# Patient Record
Sex: Female | Born: 1935 | Race: White | Hispanic: No | Marital: Married | Smoking: Former smoker
Health system: Southern US, Community
[De-identification: ages and names within clinical notes are randomized; demographics above are authoritative.]

## PROBLEM LIST (undated history)

## (undated) DIAGNOSIS — I10 Essential (primary) hypertension: Secondary | ICD-10-CM

## (undated) DIAGNOSIS — I2510 Atherosclerotic heart disease of native coronary artery without angina pectoris: Secondary | ICD-10-CM

## (undated) DIAGNOSIS — F32A Depression, unspecified: Secondary | ICD-10-CM

## (undated) DIAGNOSIS — D649 Anemia, unspecified: Secondary | ICD-10-CM

## (undated) DIAGNOSIS — I219 Acute myocardial infarction, unspecified: Secondary | ICD-10-CM

## (undated) DIAGNOSIS — R911 Solitary pulmonary nodule: Secondary | ICD-10-CM

## (undated) DIAGNOSIS — E119 Type 2 diabetes mellitus without complications: Secondary | ICD-10-CM

## (undated) DIAGNOSIS — M1990 Unspecified osteoarthritis, unspecified site: Secondary | ICD-10-CM

## (undated) DIAGNOSIS — K810 Acute cholecystitis: Secondary | ICD-10-CM

## (undated) DIAGNOSIS — E785 Hyperlipidemia, unspecified: Secondary | ICD-10-CM

## (undated) DIAGNOSIS — I779 Disorder of arteries and arterioles, unspecified: Secondary | ICD-10-CM

## (undated) DIAGNOSIS — E7800 Pure hypercholesterolemia, unspecified: Secondary | ICD-10-CM

## (undated) DIAGNOSIS — I739 Peripheral vascular disease, unspecified: Secondary | ICD-10-CM

## (undated) DIAGNOSIS — Z8709 Personal history of other diseases of the respiratory system: Secondary | ICD-10-CM

## (undated) DIAGNOSIS — F329 Major depressive disorder, single episode, unspecified: Secondary | ICD-10-CM

## (undated) HISTORY — DX: Acute myocardial infarction, unspecified: I21.9

## (undated) HISTORY — DX: Peripheral vascular disease, unspecified: I73.9

## (undated) HISTORY — DX: Type 2 diabetes mellitus without complications: E11.9

## (undated) HISTORY — DX: Atherosclerotic heart disease of native coronary artery without angina pectoris: I25.10

## (undated) HISTORY — DX: Pure hypercholesterolemia, unspecified: E78.00

## (undated) HISTORY — DX: Depression, unspecified: F32.A

## (undated) HISTORY — DX: Acute cholecystitis: K81.0

## (undated) HISTORY — DX: Solitary pulmonary nodule: R91.1

## (undated) HISTORY — DX: Major depressive disorder, single episode, unspecified: F32.9

## (undated) HISTORY — DX: Disorder of arteries and arterioles, unspecified: I77.9

## (undated) HISTORY — DX: Anemia, unspecified: D64.9

## (undated) HISTORY — DX: Unspecified osteoarthritis, unspecified site: M19.90

## (undated) HISTORY — DX: Hyperlipidemia, unspecified: E78.5

## (undated) HISTORY — DX: Essential (primary) hypertension: I10

## (undated) HISTORY — DX: Personal history of other diseases of the respiratory system: Z87.09

---

## 1998-02-10 HISTORY — PX: CORONARY ANGIOPLASTY WITH STENT PLACEMENT: SHX49

## 1998-07-17 ENCOUNTER — Encounter: Payer: Self-pay | Admitting: Emergency Medicine

## 1998-07-17 ENCOUNTER — Inpatient Hospital Stay (HOSPITAL_COMMUNITY): Admission: EM | Admit: 1998-07-17 | Discharge: 1998-07-20 | Payer: Self-pay | Admitting: Emergency Medicine

## 2000-07-21 ENCOUNTER — Ambulatory Visit (HOSPITAL_COMMUNITY): Admission: RE | Admit: 2000-07-21 | Discharge: 2000-07-21 | Payer: Self-pay | Admitting: *Deleted

## 2000-08-05 ENCOUNTER — Ambulatory Visit (HOSPITAL_COMMUNITY): Admission: RE | Admit: 2000-08-05 | Discharge: 2000-08-05 | Payer: Self-pay | Admitting: Internal Medicine

## 2000-08-05 ENCOUNTER — Encounter: Payer: Self-pay | Admitting: Internal Medicine

## 2001-06-01 ENCOUNTER — Encounter: Payer: Self-pay | Admitting: Vascular Surgery

## 2001-06-02 ENCOUNTER — Ambulatory Visit (HOSPITAL_COMMUNITY): Admission: RE | Admit: 2001-06-02 | Discharge: 2001-06-02 | Payer: Self-pay | Admitting: Vascular Surgery

## 2001-06-02 HISTORY — PX: INTRACRANIAL ARTERY ANGIOPLASTY: SHX682

## 2001-06-09 ENCOUNTER — Inpatient Hospital Stay (HOSPITAL_COMMUNITY): Admission: RE | Admit: 2001-06-09 | Discharge: 2001-06-10 | Payer: Self-pay | Admitting: Vascular Surgery

## 2001-06-09 ENCOUNTER — Encounter (INDEPENDENT_AMBULATORY_CARE_PROVIDER_SITE_OTHER): Payer: Self-pay | Admitting: Specialist

## 2001-06-10 HISTORY — PX: CAROTID ENDARTERECTOMY: SUR193

## 2001-09-21 ENCOUNTER — Encounter: Payer: Self-pay | Admitting: Internal Medicine

## 2001-09-21 ENCOUNTER — Ambulatory Visit (HOSPITAL_COMMUNITY): Admission: RE | Admit: 2001-09-21 | Discharge: 2001-09-21 | Payer: Self-pay | Admitting: Internal Medicine

## 2003-08-30 ENCOUNTER — Inpatient Hospital Stay (HOSPITAL_COMMUNITY): Admission: EM | Admit: 2003-08-30 | Discharge: 2003-09-05 | Payer: Self-pay | Admitting: Emergency Medicine

## 2003-08-31 ENCOUNTER — Encounter (INDEPENDENT_AMBULATORY_CARE_PROVIDER_SITE_OTHER): Payer: Self-pay | Admitting: *Deleted

## 2003-08-31 HISTORY — PX: CHOLECYSTECTOMY, LAPAROSCOPIC: SHX56

## 2003-10-10 ENCOUNTER — Inpatient Hospital Stay (HOSPITAL_BASED_OUTPATIENT_CLINIC_OR_DEPARTMENT_OTHER): Admission: RE | Admit: 2003-10-10 | Discharge: 2003-10-10 | Payer: Self-pay | Admitting: Cardiology

## 2003-10-10 HISTORY — PX: CARDIAC CATHETERIZATION: SHX172

## 2003-10-24 ENCOUNTER — Ambulatory Visit (HOSPITAL_COMMUNITY): Admission: AD | Admit: 2003-10-24 | Discharge: 2003-10-26 | Payer: Self-pay | Admitting: Cardiology

## 2003-10-24 HISTORY — PX: CORONARY ANGIOPLASTY WITH STENT PLACEMENT: SHX49

## 2003-11-29 ENCOUNTER — Ambulatory Visit (HOSPITAL_COMMUNITY): Admission: RE | Admit: 2003-11-29 | Discharge: 2003-11-29 | Payer: Self-pay | Admitting: Internal Medicine

## 2004-02-19 ENCOUNTER — Ambulatory Visit: Payer: Self-pay | Admitting: Internal Medicine

## 2004-03-04 ENCOUNTER — Ambulatory Visit: Payer: Self-pay | Admitting: Internal Medicine

## 2004-03-12 ENCOUNTER — Ambulatory Visit: Payer: Self-pay | Admitting: Internal Medicine

## 2004-05-27 ENCOUNTER — Ambulatory Visit: Payer: Self-pay | Admitting: Internal Medicine

## 2004-11-13 IMAGING — CR DG CHEST 2V
2 series · 2 of 2 positions shown · non-contrast
Comparison: none

CLINICAL DATA: Chest pain.  Shortness of breath.  
 PA AND LATERAL CHEST ? 10/25/2003
 Comparison examination 09/04/2003.

[view not recorded (1 of 2)]
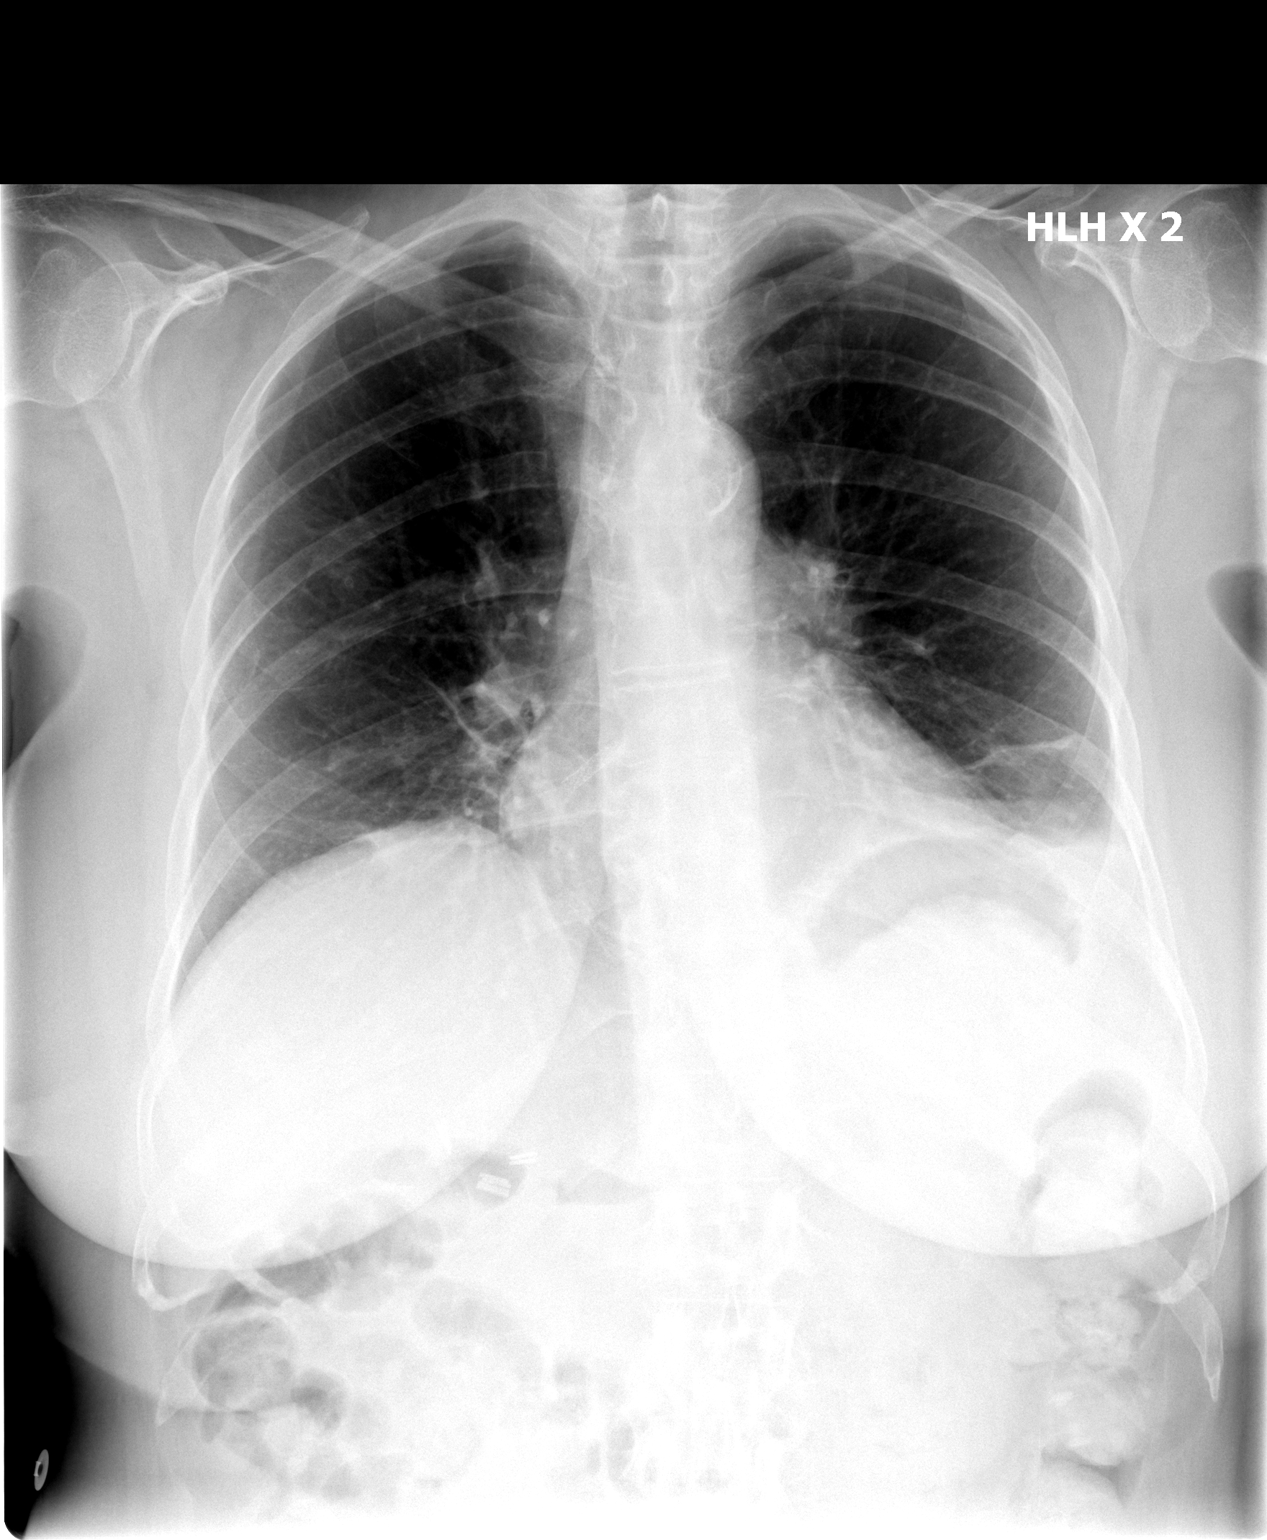

[view not recorded (2 of 2)]
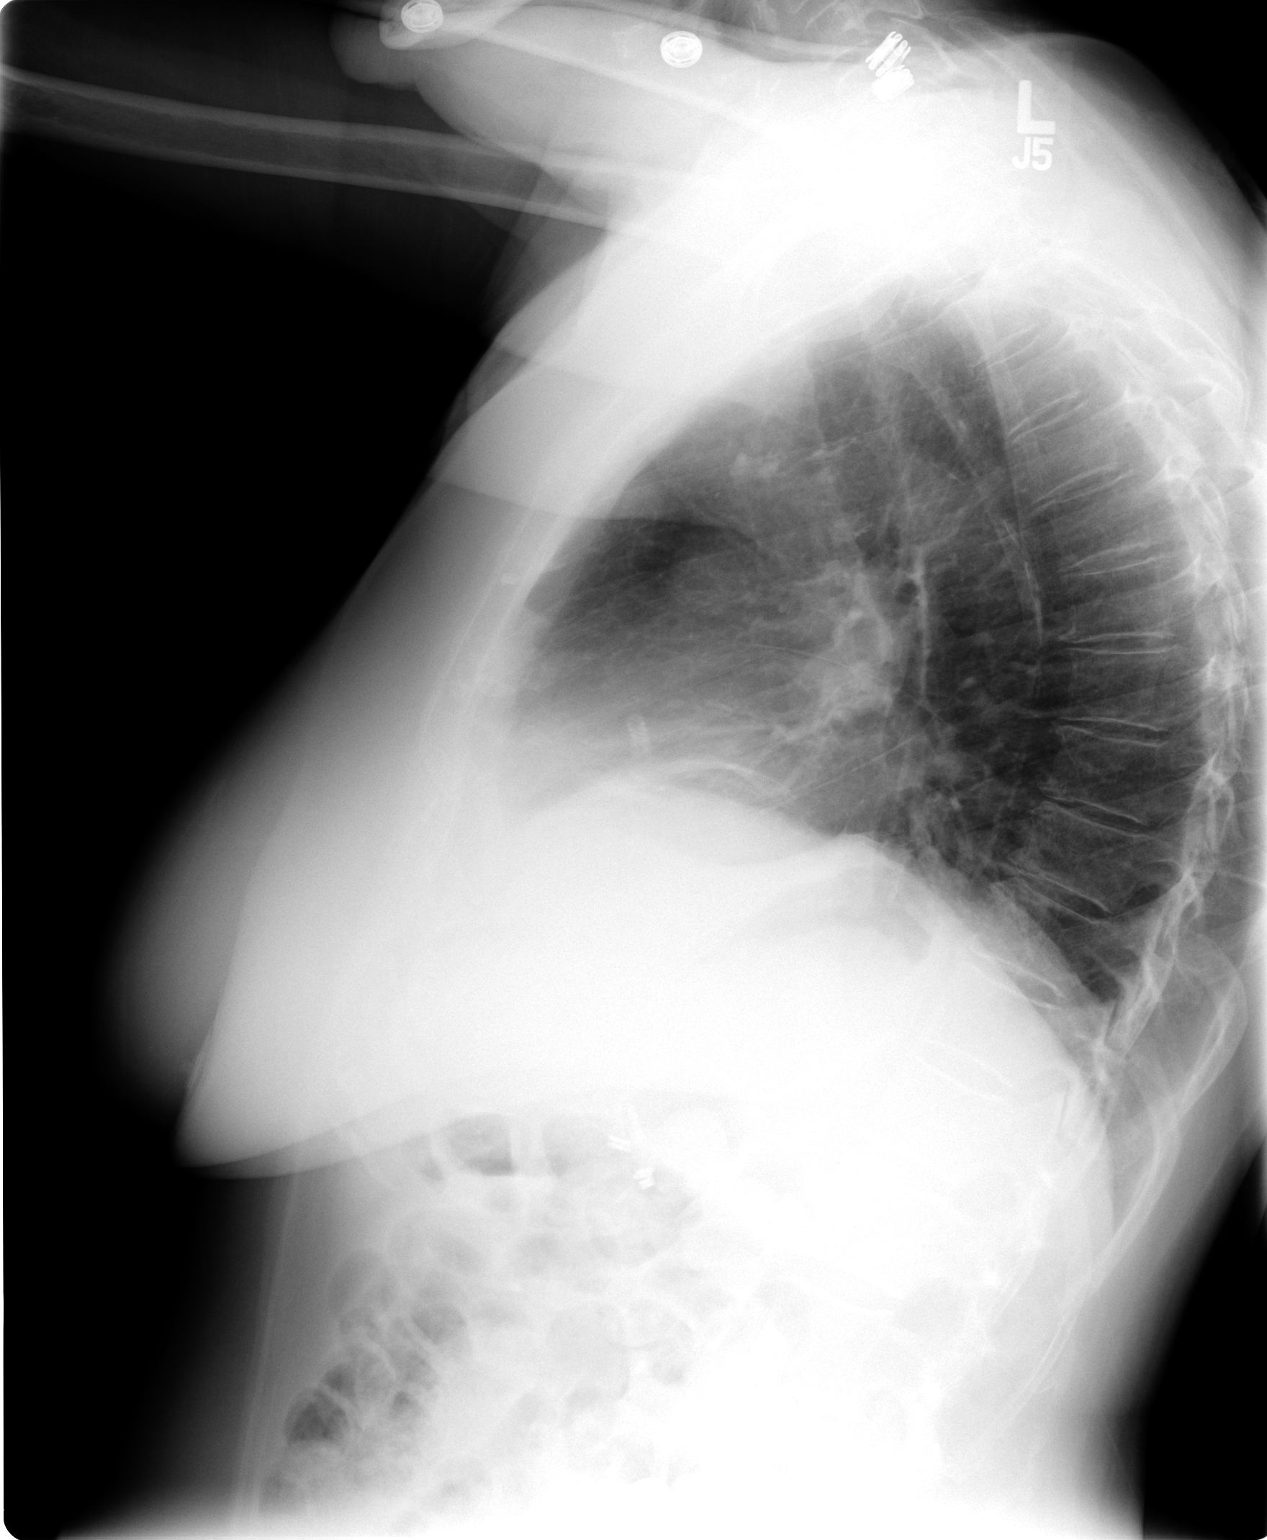

[2 of 2 positions shown; findings below may reference images not displayed]

FINDINGS: Coronary artery stent in place.  Poor inspiration with heart appearing slightly prominent in size. Bibasilar atelectatic changes.  Recommend followup until clearance.  No segmental infiltrate, congestive heart failure, or pneumothorax.  
 IMPRESSION 
 Poor inspiration with bibasilar atelectatic changes and slightly prominent appearance of the heart.  
 Coronary artery stent is noted.

## 2004-11-25 ENCOUNTER — Ambulatory Visit: Payer: Self-pay | Admitting: Internal Medicine

## 2004-12-26 ENCOUNTER — Ambulatory Visit (HOSPITAL_COMMUNITY): Admission: RE | Admit: 2004-12-26 | Discharge: 2004-12-26 | Payer: Self-pay | Admitting: Internal Medicine

## 2005-02-17 ENCOUNTER — Ambulatory Visit: Payer: Self-pay | Admitting: Internal Medicine

## 2005-07-14 ENCOUNTER — Ambulatory Visit: Payer: Self-pay | Admitting: Internal Medicine

## 2005-12-29 ENCOUNTER — Ambulatory Visit (HOSPITAL_COMMUNITY): Admission: RE | Admit: 2005-12-29 | Discharge: 2005-12-29 | Payer: Self-pay | Admitting: Internal Medicine

## 2006-01-12 ENCOUNTER — Ambulatory Visit: Payer: Self-pay | Admitting: Internal Medicine

## 2006-08-21 ENCOUNTER — Ambulatory Visit: Payer: Self-pay | Admitting: Internal Medicine

## 2006-12-31 ENCOUNTER — Ambulatory Visit (HOSPITAL_COMMUNITY): Admission: RE | Admit: 2006-12-31 | Discharge: 2006-12-31 | Payer: Self-pay | Admitting: Internal Medicine

## 2007-02-19 DIAGNOSIS — D539 Nutritional anemia, unspecified: Secondary | ICD-10-CM | POA: Insufficient documentation

## 2007-02-19 DIAGNOSIS — I10 Essential (primary) hypertension: Secondary | ICD-10-CM | POA: Insufficient documentation

## 2007-02-19 DIAGNOSIS — J449 Chronic obstructive pulmonary disease, unspecified: Secondary | ICD-10-CM

## 2007-02-19 DIAGNOSIS — Z862 Personal history of diseases of the blood and blood-forming organs and certain disorders involving the immune mechanism: Secondary | ICD-10-CM

## 2007-02-19 DIAGNOSIS — I2510 Atherosclerotic heart disease of native coronary artery without angina pectoris: Secondary | ICD-10-CM | POA: Insufficient documentation

## 2007-02-19 DIAGNOSIS — Z8639 Personal history of other endocrine, nutritional and metabolic disease: Secondary | ICD-10-CM

## 2007-02-19 DIAGNOSIS — J4489 Other specified chronic obstructive pulmonary disease: Secondary | ICD-10-CM | POA: Insufficient documentation

## 2007-02-22 ENCOUNTER — Ambulatory Visit: Payer: Self-pay | Admitting: Internal Medicine

## 2007-02-22 DIAGNOSIS — J984 Other disorders of lung: Secondary | ICD-10-CM

## 2007-02-22 DIAGNOSIS — Z9889 Other specified postprocedural states: Secondary | ICD-10-CM

## 2007-06-01 ENCOUNTER — Ambulatory Visit: Payer: Self-pay | Admitting: Internal Medicine

## 2007-07-28 ENCOUNTER — Telehealth: Payer: Self-pay | Admitting: Internal Medicine

## 2007-08-23 ENCOUNTER — Ambulatory Visit: Payer: Self-pay | Admitting: Internal Medicine

## 2007-08-23 DIAGNOSIS — I219 Acute myocardial infarction, unspecified: Secondary | ICD-10-CM | POA: Insufficient documentation

## 2007-08-23 DIAGNOSIS — E119 Type 2 diabetes mellitus without complications: Secondary | ICD-10-CM

## 2007-09-02 ENCOUNTER — Ambulatory Visit: Payer: Self-pay | Admitting: Internal Medicine

## 2007-10-19 ENCOUNTER — Encounter: Payer: Self-pay | Admitting: Internal Medicine

## 2007-12-24 ENCOUNTER — Ambulatory Visit: Payer: Self-pay | Admitting: Internal Medicine

## 2007-12-27 ENCOUNTER — Ambulatory Visit: Payer: Self-pay | Admitting: Cardiology

## 2008-01-03 ENCOUNTER — Ambulatory Visit (HOSPITAL_COMMUNITY): Admission: RE | Admit: 2008-01-03 | Discharge: 2008-01-03 | Payer: Self-pay | Admitting: Internal Medicine

## 2008-02-21 ENCOUNTER — Ambulatory Visit: Payer: Self-pay | Admitting: Internal Medicine

## 2008-04-24 ENCOUNTER — Ambulatory Visit: Payer: Self-pay | Admitting: Internal Medicine

## 2008-04-26 ENCOUNTER — Ambulatory Visit: Payer: Self-pay | Admitting: Cardiology

## 2008-05-05 ENCOUNTER — Ambulatory Visit: Payer: Self-pay | Admitting: Vascular Surgery

## 2008-07-25 ENCOUNTER — Ambulatory Visit: Payer: Self-pay | Admitting: Internal Medicine

## 2008-10-31 ENCOUNTER — Encounter: Payer: Self-pay | Admitting: Internal Medicine

## 2009-01-08 ENCOUNTER — Ambulatory Visit (HOSPITAL_COMMUNITY): Admission: RE | Admit: 2009-01-08 | Discharge: 2009-01-08 | Payer: Self-pay | Admitting: Internal Medicine

## 2009-04-26 ENCOUNTER — Ambulatory Visit: Payer: Self-pay | Admitting: Vascular Surgery

## 2009-05-01 ENCOUNTER — Encounter: Payer: Self-pay | Admitting: Internal Medicine

## 2009-07-24 ENCOUNTER — Ambulatory Visit: Payer: Self-pay | Admitting: Internal Medicine

## 2009-11-13 ENCOUNTER — Ambulatory Visit: Payer: Self-pay | Admitting: Cardiology

## 2009-11-15 ENCOUNTER — Ambulatory Visit: Payer: Self-pay | Admitting: Surgery

## 2009-11-16 ENCOUNTER — Ambulatory Visit: Payer: Self-pay | Admitting: Vascular Surgery

## 2010-03-12 NOTE — Assessment & Plan Note (Signed)
Summary: 12 months/apc   Primary Provider/Referring Provider:  Lavone Orn  CC:  Yearly Follow up.  Pt states she is using rescue inhaler more frequently with activity over the past year.  occ wheezing qhs and occ dry cough.  Denies chest tightness.  Marland Kitchen  History of Present Illness: 12/24/07-COPD, lung nodule CT 09/02/07 R mid lung nodule, 6 mm.Denies cough, phlegm, pain, nodes, blood , fever or sweats.Does notice bland rhinorhea. CT 11/09 rec f/u 6 mos for right lung nodules. She has appropriate appt in March.  02/21/08- COPD, lung nodule Nose runs,  clear secretion, no chest compliant or cough. Denies chest pain, palpitation, swollen glands, fever.  Meds are sufficient.  04/24/08- COPD, lung nodule , Hx DM, Hx MI Stayed in much of the winter and avoided significant respiratory infection. Denies cough, phlegm, fever, chest pain, palpitation or blood.  07/25/08- COPD, lung, Hx DM, Hx MI No change in exertional dyspnea, has to avoid heat, but denies chest pain, palpitation, cough, wheeze or phlegm. CT reviewed from March- stable right lung nodules consistent with granulomas, also some persitent elevation left diaphragm and calcified atherosclerosis.  July 24, 2009- COPD, lung nodules, Hx DM, Hx MI One year f/u. Denies acute problems.Admits in past year she has used rescue inhaler more often with exertion- estimating more than once daily. Only notes wheeze at night- prevented by use of Proventil at bedtime. Denies routine dry cough, as would be from Lisinopril. Blames sinus drainage. Denies anginal pain, palpitation or swelling. CXR- 04/2008- stable ? granulomas.  Preventive Screening-Counseling & Management  Alcohol-Tobacco     Smoking Status: quit > 6 months     Year Quit: 2000  Current Medications (verified): 1)  Simvastatin 20 Mg  Tabs (Simvastatin) .... Take 1 Tablet By Mouth Once A Day 2)  Lisinopril 40 Mg  Tabs (Lisinopril) 3)  Tramadol-Acetaminophen 37.5-325 Mg Tabs  (Tramadol-Acetaminophen) .Marland Kitchen.. 1 Every 6 Hours As Needed For Pain 4)  Metoprolol Succinate 25 Mg Xr24h-Tab (Metoprolol Succinate) .... Take 1 Tablet By Mouth Two Times A Day 5)  Glimepiride 2 Mg Tabs (Glimepiride) .Marland Kitchen.. 1 Every Morning 6)  Spiriva Handihaler 18 Mcg  Caps (Tiotropium Bromide Monohydrate) .... Inhale One Capsule Every Day 7)  Proventil Hfa 108 (90 Base) Mcg/act  Aers (Albuterol Sulfate) .... 2 Puffs Four Times A Day As Needed 8)  Nexium 40 Mg  Cpdr (Esomeprazole Magnesium) .... Take 1 Tablet By Mouth Once A Day 9)  Aricept 10 Mg Tabs (Donepezil Hcl) .Marland Kitchen.. 1 Once Daily 10)  Sertraline Hcl 100 Mg  Tabs (Sertraline Hcl) .... Take 1 By Mouth Once Daily 11)  Vitamin D 400 Unit  Caps (Cholecalciferol) .... Take 2 By Mouth Two Times A Day 12)  Calcium 600 600 Mg  Tabs (Calcium Carbonate) .... Take 1 By Mouth Two Times A Day 13)  Cvs Vitamin E 400 Unit  Caps (Vitamin E) .... Take 1 By Mouth At Bedtime 14)  Bayer Aspirin 325 Mg Tabs (Aspirin) .Marland Kitchen.. 1 Once Daily 15)  Fish Oil ?strength .... Once Daily  Allergies (verified): No Known Drug Allergies  Past History:  Past Medical History: Last updated: 12/24/2007 chronic elevation Left hemidiaphragm pulmonary restriction Coronary Heart Disease Diabetes, Type 2 Hypertension Myocardial Infarction chronic anemia Lung nodule, RML  Past Surgical History: Last updated: 02/21/2008 carotid endarterectomy -right Cholecystectomy  Family History: Last updated: March 03, 2007 Father died MI Mother- Alzheimers  Social History: Last updated: 02/21/2008 Patient states former smoker.  married  Risk Factors: Smoking Status: quit >  6 months (07/24/2009)  Social History: Smoking Status:  quit > 6 months  Review of Systems      See HPI       The patient complains of shortness of breath with activity.  The patient denies shortness of breath at rest, productive cough, non-productive cough, coughing up blood, chest pain, irregular heartbeats,  acid heartburn, indigestion, loss of appetite, weight change, abdominal pain, difficulty swallowing, sore throat, tooth/dental problems, headaches, nasal congestion/difficulty breathing through nose, and sneezing.    Vital Signs:  Patient profile:   77 year old female Height:      63 inches Weight:      160 pounds BMI:     28.45 O2 Sat:      97 % on Room air Pulse rate:   55 / minute BP sitting:   130 / 60  (right arm) Cuff size:   regular  Vitals Entered By: Gweneth Dimitri RN (July 24, 2009 1:57 PM)  O2 Flow:  Room air CC: Yearly Follow up.  Pt states she is using rescue inhaler more frequently with activity over the past year.  occ wheezing qhs, occ dry cough.  Denies chest tightness.   Comments Medications reviewed with patient Daytime contact number verified with patient. Gweneth Dimitri RN  July 24, 2009 1:57 PM    Physical Exam  Additional Exam:  General: A/Ox3; pleasant and cooperative, NAD, SKIN: no rash, lesions NODES: no lymphadenopathy HEENT: [place]/AT, EOM- WNL, Conjuctivae- clear, PERRLA, TM-WNL, Nose- clear, Throat- clear and wnl NECK: Supple w/ fair ROM, JVD- none, Pulsatile right carotid endarterectomy scar- feels like a carotid aneurysm. Thyroid-  CHEST: Clear to P&A, sounds are diminished with no rales, wheeze, rhonchi or dullness HEART: RRR, no m/g/r heard ABDOMEN:  UEA:VWUJ, nl pulses, no edema  NEURO: Grossly intact to observation      Impression & Recommendations:  Problem # 1:  LUNG NODULE (ICD-518.89)  Probably old granulomas. We will update CXR.  Problem # 2:  Hx of MYOCARDIAL INFARCTION (ICD-410.90)  Not symptomatic unless CAD contributes to exertional dyspnea. She shows me NTG, emphasizing that she never needs to take it. Her updated medication list for this problem includes:    Lisinopril 40 Mg Tabs (Lisinopril)    Metoprolol Succinate 25 Mg Xr24h-tab (Metoprolol succinate) .Marland Kitchen... Take 1 tablet by mouth two times a day    Bayer Aspirin 325 Mg  Tabs (Aspirin) .Marland Kitchen... 1 once daily    Nitrostat 0.4 Mg Subl (Nitroglycerin) .Marland Kitchen... As directed  Problem # 3:  COPD (ICD-496) Occasional need for rescue inhaler. I think she uses it for exertional dyspnea that may not inclue wheeze, but she doesn't seem to be overusing it. Continues Spiriva.  Medications Added to Medication List This Visit: 1)  Metoprolol Succinate 25 Mg Xr24h-tab (Metoprolol succinate) .... Take 1 tablet by mouth two times a day 2)  Fish Oil ?strength  .... Once daily 3)  Nitrostat 0.4 Mg Subl (Nitroglycerin) .... As directed  Other Orders: Est. Patient Level IV (99214) T-2 View CXR (71020TC)  Patient Instructions: 1)  Please schedule a follow-up appointment in 1 year. 2)  A chest x-ray has been recommended.  Your imaging study may require preauthorization.  3)  Scripts for Proventil rescue inhaler and for Spiriva. Prescriptions: PROVENTIL HFA 108 (90 BASE) MCG/ACT  AERS (ALBUTEROL SULFATE) 2 puffs four times a day as needed  #3 x 3   Entered and Authorized by:   Waymon Budge MD   Signed by:  Waymon Budge MD on 07/24/2009   Method used:   Print then Give to Patient   RxID:   [id number] SPIRIVA HANDIHALER 18 MCG  CAPS (TIOTROPIUM BROMIDE MONOHYDRATE) inhale one capsule every day  #90 x 3   Entered and Authorized by:   Waymon Budge MD   Signed by:   Waymon Budge MD on 07/24/2009   Method used:   Print then Give to Patient   RxID:   [id number] NITROSTAT 0.4 MG SUBL (NITROGLYCERIN) As directed  #1 bottle x prn   Entered and Authorized by:   Waymon Budge MD   Signed by:   Waymon Budge MD on 07/24/2009   Method used:   Print then Give to Patient   RxID:   [id number]    Immunization History:  Influenza Immunization History:    Influenza:  historical (10/11/2008)

## 2010-03-12 NOTE — Letter (Signed)
Summary: [facility]   Imported By: Sherian Rein 05/14/2009 11:56:28  _____________________________________________________________________  External Attachment:    Type:   Image     Comment:   External Document

## 2010-05-31 ENCOUNTER — Ambulatory Visit: Payer: Self-pay | Admitting: Vascular Surgery

## 2010-05-31 ENCOUNTER — Other Ambulatory Visit: Payer: Self-pay

## 2010-06-25 NOTE — Procedures (Signed)
CAROTID DUPLEX EXAM   INDICATION:  Followup of carotid disease.   HISTORY:  Diabetes:  Yes.  Cardiac:  PTCA and stent 07/1998.  Hypertension:  Yes.  Smoking:  Previous.  Previous Surgery:  Right carotid endarterectomy in 2003.  CV History:  Currently asymptomatic.  Amaurosis Fugax Yes No, Paresthesias Yes No, Hemiparesis Yes No                                       RIGHT             LEFT  Brachial systolic pressure:         175               155  Brachial Doppler waveforms:         Triphasic         Triphasic  Vertebral direction of flow:        Antegrade         Antegrade  DUPLEX VELOCITIES (cm/sec)  CCA peak systolic                   98                110  ECA peak systolic                   101               143  ICA peak systolic                   99                206  ICA end diastolic                   21                57  PLAQUE MORPHOLOGY:                  Calcified         Calcified / mixed  PLAQUE AMOUNT:                      Mild              Moderate to severe  PLAQUE LOCATION:                    CCA               CCA / ICA / ECA   IMPRESSION:  1. Patent right internal carotid artery with no evidence of      restenosis.  2. 60%-79% stenosis noted in the left internal carotid artery.  3. Bilateral common carotid artery calcified plaque noted.   ___________________________________________  Di Kindle. Edilia Bo, M.D.   CJ/MEDQ  D:  04/26/2009  T:  04/26/2009  Job:  [id number]

## 2010-06-25 NOTE — Assessment & Plan Note (Signed)
[facility]                             PULMONARY OFFICE NOTE   KALIEGH, WILLADSEN                        MRN:          [id number]  DATE:08/21/2006                            DOB:          1933/01/14    PULMONARY OFFICE FOLLOWUP.   PROBLEM:  1. Chronic obstructive pulmonary disease with pulmonary restriction.  2. Coronary disease/infarction/stent.  3. Diabetes.  4. Hypertension.  5. Chronic anemia.  6. Left diaphragm paresis.   HISTORY:  She stays in during the mid day to avoid heat and humidity but  otherwise has felt quite stable.  Very much likes the Proventil HFA  inhaler we gave her as a rescue inhaler last time and is using Advair,  as directed, as a maintenance medications.  No cough or phlegm, no  sudden events or acute changes.  We reviewed her medications.  She has  stopped Aciphex.  She continues lisinopril but well tolerated, and she  continues Spiriva.  She needs meds refilled.   OBJECTIVE:  Weight 157 pounds, BP 110/58, pulse 56, room air saturation  96%.  CHEST:  Quiet.  Breath sounds are a little diminished, unlabored.  HEART SOUNDS:  Regular.  I do not hear a murmur, there is no edema.   IMPRESSION:  Stable chronic obstructive pulmonary disease with left  hemidiaphragm paresis.   PLAN:  1. Refilled Proventil HFA, as a rescue inhaler, two puffs q.i.d.      p.r.n.  2. Refilled Advair 100/50, one puff b.i.d., as a maintenance med.  3. Schedule return in 6 months, anticipating chest x-ray and possibly      PFT then.     Clinton D. Maple Hudson, MD, Tonny Bollman, FACP  Electronically Signed    CDY/MedQ  DD: 08/21/2006  DT: 08/23/2006  Job #: [id number]   cc:   Juline Patch, M.D.  Peter M. [place], M.D.

## 2010-06-25 NOTE — Assessment & Plan Note (Signed)
OFFICE VISIT   Kara Price, Kara Price  DOB:  1932/07/04                                       11/16/2009  CHART#:[id number]   This is a new patient evaluation as she has not been seen by a physician  in over 3 years.   HISTORY OF PRESENT ILLNESS:  This is a 77 year old female that  previously has undergone a right carotid endarterectomy who now presents  with chief complaint of followup bilateral carotid stenosis.  Most  recently was seen in the peripheral lab with a bilateral carotid duplex  that was done on 04/26/2009.  It demonstrated no recurrence of the  stenosis in the right side and a 60%-79% stenosis on the left.  At this  point she denies any TIA or stroke symptoms, never has had any amaurosis  fugax or monocular blindness, never had any facial drooping or  hemiplegia.  She never has had any expressive or receptive aphasia.  Her  risk factors for her carotid disease include hyperlipidemia,  hypertension, diabetes.  She denies smoking.  In regards to her risk  factor management, she is currently on Zocor for statin, aspirin for  antiplatelets.  For her diabetes control, she is taking glimepiride and  for antihypertensive management she is on metoprolol and lisinopril.  The rest of her neuro review of systems was negative.  This patient  notes a greater than 8 year history of intermittent claudication.  She  notes that she develops calf pain with ambulating greater than 500 feet.  However, she notes that this does not interfere with any of her  activities of daily living.  She has never developed any gangrene or any  ulcers in either extremity and never has had any symptoms consistent  with rest pain.   PAST MEDICAL HISTORY:  1. Coronary artery disease.  2. History of peripheral arterial disease with intermittent      claudication.  3. 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  4. Osteoarthritis.  5. History of a heart attack.  6.  Diabetes.   PAST SURGICAL HISTORY:  1. Includes a cardiac cath with stenting.  2. Cholecystectomy.  3. Right carotid endarterectomy which was completed 06/09/2001 by Dr.      Hart [place].   SOCIAL HISTORY:  She never smoked.  Denies any alcohol or illicit drug  use.   FAMILY HISTORY:  significant for her father having a myocardial  infarction with coronary artery disease.  Mother's history is not  available.   MEDICATIONS:  included Zocor, lisinopril, metoprolol, glimepiride,  Zoloft, Spiriva, Tramadol/APAP, Nexium, Aricept, aspirin, vitamin D and  calcium.   ALLERGIES:  no known drug allergies.   REVIEW OF SYSTEMS:  On her review of systems today she noted shortness  of breath with exertion.  Otherwise the rest of her 12 point review of  systems was noted to be negative.   PHYSICAL EXAMINATION:  Vital signs:  Blood pressure 178/79, heart rate  of 97, respirations 12.  General:  Alert and oriented x3, well-developed, well-nourished, no  apparent distress.  Head:  Normocephalic, atraumatic.  No obvious temporalis wasting.  ENT:  Hearing was grossly intact.  Nares without any erythema or  drainage.  Oropharynx without any erythema or exudate.  Eyes:  Pupils were equal, round, reactive to light.  Extraocular  movements were intact.  Neck:  Soft.  No nuchal rigidity.  No palpable lymphadenopathy.  Pulmonary:  Symmetric expansion, good air movement, clear to  auscultation bilaterally.  No rales, rhonchi or wheezing.  Cardiac:  Regular rate and rhythm, normal S1, S2.  No murmurs, rubs,  thrills or gallops.  Vascular:  She had palpable upper extremity pulses  bilaterally.  The carotids had no bruits and the aorta was not palpable.  Bilateral femoral pulses were palpable.  The right popliteal, posterior  tibial and dorsal pedis were palpable.  The left side I could not  appreciate a palpable pulse in the popliteal, posterior tibial or dorsal  pedis.  GI:  Soft, nontender, nondistended.   No guarding, no rebound, no  hepatosplenomegaly.  No masses.  No costovertebral angle tenderness  bilaterally.  Musculoskeletal:  5/5 strength throughout all extremities.  There were  no wounds in any extremity.  Neurological:  Cranial nerves II-XII were intact.  The motor exam was as  listed above.  The sensation was intact in all extremities.  Psychiatric:  Judgment appeared intact.  Mood and affect were  appropriate for clinical situation.  Skin:  The extremity exam was as noted otherwise no rashes were noted  elsewhere.  Lymphatics:  There were no palpable cervical, axillary, inguinal  lymphadenopathy.   NONINVASIVE VASCULAR IMAGING:  There were two studies:  1. Bilateral carotid duplex: On the right side this is the      endarterectomy side per 2003, peak systolic of 99 with diastolic of      21.  This is consistent with no hemodynamically significant      internal carotid stenosis.  On the left side peak systolic velocity      is 206/57.  The ICA:CCA ratio was less than 2.  This is consistent      with a 60%-79% stenosis of the left internal carotid artery.  2. Bilateral lower extremity ABIs: She had on the right side an ABI of      0.75 with biphasic waveforms in the posterior tibial and dorsal      pedis.  On the left side we see monophasic waveforms in the left      leg with an ABI of 0.61.  This is consistent with bilateral      evidence of moderate peripheral arterial disease.  Based on      waveform analysis however the left one is probably consistent with      moderate to severe peripheral arterial disease.   MEDICAL DECISION MAKING:  This is a 77 year old female who is status  post a right carotid endarterectomy.  She has a left internal carotid  artery stenosis which at this point will be just observed on a q6 month  basis.  Additionally, at the same time I would repeat her ABIs to screen  her peripheral arterial disease.  At this point she has intermittent   claudication that is not lifestyle limiting.  She does not want any  intervention.  She is on appropriate medication regimen for both her  carotid disease and her peripheral arterial disease so there is no  benefit in her intermittent claudicate her to intervene aggressively.  The data shows for intermittent claudcation, 75% of these patients have  stable symptoms or improvement in symptoms.  She will follow up with [place]  at a 6 month basis for both her carotid and her lower extremity disease.  If she develops any symptomatology before then I have already instructed  her  she is to follow up with [place] and she agrees to proceed forward with  such.     Leonides Sake, MD  Electronically Signed   BC/MEDQ  D:  11/16/2009  T:  11/16/2009  Job:  [id number]

## 2010-06-25 NOTE — Procedures (Signed)
CAROTID DUPLEX EXAM   INDICATION:  Follow up carotid artery disease.   HISTORY:  Diabetes:  Yes.  Cardiac:  PTCA and stent, 07/1998.  Hypertension:  Yes.  Smoking:  Previous.  Previous Surgery:  Right CEA, 2003.  CV History:  Asymptomatic.  Amaurosis Fugax , Paresthesias , Hemiparesis                                       RIGHT             LEFT  Brachial systolic pressure:         155               152  Brachial Doppler waveforms:         Within normal limits                Within normal limits  Vertebral direction of flow:        Antegrade         Antegrade  DUPLEX VELOCITIES (cm/sec)  CCA peak systolic                   74                84  ECA peak systolic                   74                113  ICA peak systolic                   109               170  ICA end diastolic                   28                37  PLAQUE MORPHOLOGY:                  Calcific          Calcific  PLAQUE AMOUNT:                      Mild              Moderate  PLAQUE LOCATION:                    CCA               Bifurcation   IMPRESSION:  1. Right internal carotid artery shows no evidence of restenosis,      status post carotid endarterectomy.  2. Left internal carotid artery velocities suggest 40% to 59%      stenosis.  3. Velocities may be under-estimated due to a calcific  plaque and      acoustic shadowing.   ___________________________________________  V. Charlena Cross, MD   EM/MEDQ  D:  11/15/2009  T:  11/15/2009  Job:  [id number]

## 2010-06-25 NOTE — Procedures (Signed)
CAROTID DUPLEX EXAM   INDICATION:  Follow-up evaluation of known carotid artery disease.   HISTORY:  Diabetes:  Yes.  Cardiac:  PTCA and stent in June, 2000.  Hypertension:  Yes.  Smoking:  Quit in 2000.  Previous Surgery:  Right carotid endarterectomy with Dacron patch  angioplasty in 2003.  CV History:  Patient reports no cerebrovascular symptoms at this time.  Previous duplex on 10/01/04 revealed 1-39% right ICA stenosis and 40-59%  left ICA stenosis.  Amaurosis Fugax No, Paresthesias No, Hemiparesis No.                                       RIGHT             LEFT  Brachial systolic pressure:         180               180  Brachial Doppler waveforms:         Triphasic         Triphasic  Vertebral direction of flow:        Antegrade         Antegrade  DUPLEX VELOCITIES (cm/sec)  CCA peak systolic                   73                69  ECA peak systolic                   70                114  ICA peak systolic                   93                123  ICA end diastolic                   20                30  PLAQUE MORPHOLOGY:                  Heterogenous      Calcified  PLAQUE AMOUNT:                      Mild-to-moderate  Mild  PLAQUE LOCATION:                    Proximal ICA      Proximal ICA   IMPRESSION:  1. 20-39% right internal carotid artery stenosis, status post      endarterectomy.  2. 40-59% left internal carotid artery stenosis.  3. No significant change from previous study.   ___________________________________________  Quita Skye Hart [place], M.D.   MC/MEDQ  D:  05/05/2008  T:  05/05/2008  Job:  [id number]

## 2010-06-28 NOTE — Op Note (Signed)
[name], Kara Price                           ACCOUNT NO.:  [id number]   MEDICAL RECORD NO.:  [id number]                   PATIENT TYPE:  INP   LOCATION:  4710                                 FACILITY:  [facility]   PHYSICIAN:  Vikki Ports, M.D.         DATE OF BIRTH:  07-21-1932   DATE OF PROCEDURE:  08/31/2003  DATE OF DISCHARGE:  09/05/2003                                 OPERATIVE REPORT   PREOPERATIVE DIAGNOSIS:  Acute cholecystitis.   POSTOPERATIVE DIAGNOSIS:  Gangrenous cholecystitis.   PROCEDURE:  Laparoscopic cholecystectomy with interoperative cholangiogram.   SURGEON:  Vikki Ports, M.D.   ASSISTANT:  Ollen Gross. Carolynne Edouard, M.D.   ANESTHESIA:  General.   DESCRIPTION OF PROCEDURE:  The patient was taken to the operating room and  placed in a supine position.  After adequate general anesthesia was induced  using endotracheal tube, the abdomen was prepped and draped in the normal  sterile fashion.  Using a transverse infraumbilical incision, I dissected  down and into the fascia.  The fascia was opened vertically.  An 0 Vicryl  purse-string suture was placed around the fascial defect.  A Hasson trocar  was placed in the abdomen and the abdomen was insufflated with carbon  dioxide.  Under direct visualization, a 10 mm port was placed in the  subxiphoid region and two 5 mm ports were placed in the right abdomen.  The  gallbladder was identified, was very gangrenous, and cyst aspirator was used  to decompress the gallbladder.  It was grasped and retracted cephalad.  Dissection at the neck showed significant gangrene.  I identified the cystic  duct.  It was clipped proximally and ductotomy was made.  Cholangiogram was  performed which showed slow flow into the duodenum, questionable filling  defect in the distal common duct, but it cleared easily with additional  contrast.  The catheter was then removed.  The duct was triply clipped and  divided.  The cystic artery was  dissected free in a similar fashion, triply  clipped and divided.  The gallbladder was very difficult to take off the  gallbladder bed secondary to gangrene and inflammation.  It was placed in an  endocatch bag and removed through the umbilical port.  Adequate hemostasis  was insured.  Because of the significant amount of dissection, I opted to  leave a 19 Blake drain in the gallbladder bed.  This was brought out through  one of the 5 mm trocar stab wounds.  Pneumoperitoneum was released.  The  infraumbilical fascial defect was closed with Vicryl sutures.  The skin  incisions were closed with staples.  The patient tolerated the procedure  well and went to the PACU in stable condition.  Vikki Ports, M.D.    KRH/MEDQ  D:  09/09/2003  T:  09/09/2003  Job:  [id number]

## 2010-06-28 NOTE — Discharge Summary (Signed)
NAME:  Kara Price, Kara Price                           ACCOUNT NO.:  [id number]   MEDICAL RECORD NO.:  [id number]                   PATIENT TYPE:  OIB   LOCATION:  6527                                 FACILITY:  [facility]   PHYSICIAN:  Peter M. Swaziland, M.D.               DATE OF BIRTH:  1932-07-26   DATE OF ADMISSION:  10/24/2003  DATE OF DISCHARGE:  10/26/2003                                 DISCHARGE SUMMARY   PREOPERATIVE DIAGNOSIS:  Kara Price is a 77 year old white female who was  admitted for a coronary interventional procedure.  She had had a remote  inferior infarction in 2000 and had stenting of the proximal right coronary  artery.  She had recently been admitted in July with acute gangrenous  cholecystitis and underwent cholecystectomy.  She had some transient  congestive heart failure postoperatively and had elevated troponins at that  time.  Since then she has had refractory pain across her shoulders and down  her left arm and symptoms of persistent dyspnea.  CT of the chest was  unremarkable.  Cardiolite study showed a small area of inferolateral  ischemia, and cardiac catheterization subsequently demonstrated an 80%  stenosis in the distal right coronary artery and 40-50% focal stenosis  within the previous stented segment.  She is now admitted for coronary  intervention.  For details of her past medical history, social history,  family history, and physical exam, please see admission history and  physical.   HOSPITAL COURSE:  The patient was taken directly to the cardiac  catheterization laboratory.  She underwent a coronary intervention  procedure.  The previous stented segment in the proximal right coronary  artery was dilated using a 3.5 mm cutting balloon.  We then stented the  distal right coronary artery using a 3.0 x 18 mm Cypher drug-eluting stent.  This yielded an excellent angiographic result.  During the procedure the  patient was quite short of breath and had increased  respiratory rate.  This  appeared to improve with nebulizer therapy.  She did well following her  cardiac catheterization procedure with no groin complications, chest pain,  or other complaints, and her ECG remained stable.  However, she remained  persistently short of breath and was unable to lie flat.  The following  morning she had increased respiratory rate of 30 and was wheezing.  It was  felt that she would not be ready for discharge because of her respiratory  status.  Additional laboratory data was obtained.  Her BMET was normal with  the exception of glucose at 201.  White count was normal at 4700.  Hemoglobin was 10.8, which was at her baseline.  Her BNP level was 70.  We  did obtain an arterial blood gas in the catheterization lab, which showed a  pH of 7.33, PCO2 of 46, PO2 of 103, and bicarb of 24 on 3 L nasal cannula.  Chest x-ray showed bibasilar atelectasis.  It was felt that her breathing  difficulties were predominantly related to persistent atelectasis.  She was  started on nebulizer therapy with Atrovent and albuterol inhalers.  She was  started on Advair inhaler.  She was instructed in use of incentive  spirometry.  Her respiratory status improved significantly by the following  day, and it was felt that she was stable for discharge.   DISCHARGE DIAGNOSES:  1.  Atherosclerotic coronary artery disease with remote inferior myocardial      infarction.  Now status post stenting of the distal right coronary      artery.  2.  Persistent dyspnea secondary to bilateral atelectasis.  3.  Chronic anemia.  4.  Status post cholecystectomy.  5.  Diabetes mellitus type 2.  6.  Hypertension.   DISCHARGE MEDICATIONS:  Resume her prior medications, which include:   1.  Amaryl 4 mg per day.  2.  Zocor 20 mg daily.  3.  Celexa 40 mg per day.  4.  Zestril 40 mg per day.  5.  Metoprolol 25 mg twice a day.  6.  Lasix 20 mg per day.  7.  Potassium 20 mEq daily.  8.  Norvasc 5 mg  per day.  9.  Aspirin 325 mg daily.  10. Plavix 75 mg per day is added.  11. Albuterol 2.5 mg and Atrovent 0.5 mg nebulizer therapy q.6h.  12. Advair Diskus 100/50 mcg one puff b.i.d.   Will use incentive spirometry every two hours while awake.  Will use a low-  fat, carbohydrate-modified diet.  Will follow up with Dr. Swaziland in one  week.   DISCHARGE STATUS:  Improved.                                                Peter M. Swaziland, M.D.    PMJ/MEDQ  D:  10/26/2003  T:  10/26/2003  Job:  [id number]   cc:   Juline Patch, M.D.  [address]  [place], [place] [address]  Fax: [phone]   Lorne Skeens. Hoxworth, M.D.  [address], [address]  [place]  [place] [address]  Fax: [phone]

## 2010-06-28 NOTE — H&P (Signed)
NAMENEVA, Kara Price                           ACCOUNT NO.:  [id number]   MEDICAL RECORD NO.:  [id number]                   Price TYPE:  AMB   LOCATION:                                       FACILITY:  [facility]   PHYSICIAN:  Peter M. Swaziland, M.D.               DATE OF BIRTH:  06-05-1932   DATE OF ADMISSION:  10/10/2003  DATE OF DISCHARGE:                                HISTORY & PHYSICAL   HISTORY OF PRESENT ILLNESS:  Kara Price is a 77 year old white female with  history of coronary artery disease.  She was admitted approximately three  weeks ago with acute gangrenous cholecystitis and underwent a laparoscopic  cholecystectomy.  Postoperatively she was hypotensive and was given a lot of  fluids.  She subsequently developed acute volume overload with congestive  heart failure, had a mild increase in her cardiac troponins.  She was  treated medically with IV diuresis and improved.  Since discharge she has  complained of hurting across her shoulders bilaterally and midway down her  left biceps.  She has had no significant chest pain but does get short of  breath with activity.  To further evaluate her symptoms she underwent an  adenosine Cardiolite study on September 22, 2003.  This showed a mild area of  ischemia in Kara inferolateral wall.  Left ventricular systolic function was  moderately reduced with ejection fraction of 39% with inferolateral  hypokinesia.  These findings were new compared to 2001 and because of this,  she is now admitted for cardiac catheterization.  Kara Price does have a  history of coronary disease.  She is status post acute inferior myocardial  infarction in June 2000.  At that time she had emergent stenting of Kara  proximal right coronary artery using a 3.5 x 18 mm NIR Royale stent.  She  was also noted at that time to have a 60-70% stenosis in Kara left circumflex  coronary artery.  Her last follow-up Cardiolite study in 2001 was normal.   PAST MEDICAL HISTORY:  1. Recent cholecystectomy for gangrenous cholecystitis.  2. History of coronary artery disease with remote inferior myocardial     infarct treated with direct stenting.  3. Recent congestive heart failure post cholecystectomy.  4. Hypertension.  5. Diabetes mellitus, noninsulin-requiring.  6. Hyperlipidemia.   CURRENT MEDICATIONS:  1. Coated aspirin 325 mg daily.  2. Amaryl 4 mg per day.  3. Zocor 20 mg per day.  4. Celexa 40 mg per day.  5. Metoprolol 25 mg b.i.d.  6. Zestril 40 mg per day.  7. Lasix 20 mg per day.  8. Potassium 20 mEq per day.  9. Norvasc 5 mg per day.   She has no known allergies.   Other surgery history includes a previous right carotid endarterectomy.   FAMILY HISTORY:  Father died of myocardial infarction in his 65s.  Mother is  in her 90s and doing well.  She has a brother who has coronary artery  disease.   SOCIAL HISTORY:  Kara Price is married.  She denies tobacco or alcohol use.   PHYSICAL EXAMINATION:  GENERAL:  Kara Price is a pleasant white female in  no apparent distress.  VITAL SIGNS:  Her weight is 150.  Blood pressure is 128/70, pulse 72 and  regular.  HEENT:  Normocephalic, atraumatic.  Pupils equal, round, and reactive.  Conjunctivae are clear.  Oropharynx is clear.  NECK:  Supple without JVD, adenopathy, thyromegaly, or bruits.  She has an  old right CEA scar.  CHEST:  Lungs were clear.  CARDIAC:  Regular rate and rhythm without gallop, murmur, or click.  ABDOMEN:  Her laparoscopic scars have healed well.  EXTREMITIES:  She has no lower extremity edema.  Pulses are 2+ and  symmetric.  NEUROLOGIC:  Nonfocal.   LABORATORY DATA:  Resting ECG shows normal sinus rhythm with nonspecific T-  wave abnormality.  Most recent chest x-ray dated September 04, 2003, showed small  bilateral effusions, otherwise clear.   IMPRESSION:  1. Atherosclerotic coronary artery disease with remote inferior myocardial     infarction, treated with direct  stenting of Kara right coronary artery.     Now with symptoms of bilateral shoulder and arm pain, possibly anginal     equivalent.  Adenosine Cardiolite study is abnormal showing evidence of     inferolateral ischemia.  2. Acute congestive heart failure post cholecystectomy secondary to volume     overload.  Moderate left ventricular dysfunction by Cardiolite study.  3. Status post cholecystectomy.  4. Diabetes mellitus, noninsulin-requiring.  5. Hypertension.  6. Hyperlipidemia.  7. Status post right carotid endarterectomy.   PLAN:  Will proceed with cardiac catheterization with further therapy  pending these results.                                                Peter M. Swaziland, M.D.    PMJ/MEDQ  D:  10/04/2003  T:  10/04/2003  Job:  [id number]   cc:   Juline Patch, M.D.  [address]  [place], [place] [address]  Fax: [phone]   Angelia Mould. Derrell Lolling, M.D.  [address]  [place]  [place] [address]  Fax: [phone]

## 2010-06-28 NOTE — Consult Note (Signed)
NAME:  Kara Price, Kara Price                           ACCOUNT NO.:  [id number]   MEDICAL RECORD NO.:  [id number]                   PATIENT TYPE:  INP   LOCATION:  4710                                 FACILITY:  [facility]   PHYSICIAN:  Petra Kuba, M.D.                 DATE OF BIRTH:  Dec 22, 1932   DATE OF CONSULTATION:  DATE OF DISCHARGE:                                   CONSULTATION   REQUESTING PHYSICIAN:  Peter M. Swaziland, M.D.   HISTORY:  The patient is seen at the request of Dr. Swaziland for abdominal  pain and abnormal CAT scan pertinent for a gallstone at the neck of the  gallbladder and no other obvious findings.  She had pain about a week ago  that radiated to her back and then yesterday had increasing upper abdominal  pain and some nausea and vomiting.  Today she had a fever.  She has really  not had much GI symptoms.  GI cocktails have not helped.  At home she did  take some pain medicines but has not used any other antacids.  She had a  colonoscopy a few years ago by Dr. Virginia Rochester which showed hemorrhoids only but has  had no other GI tests, ultrasounds, etcetera.  Never been told she had  gallstones before.   PAST MEDICAL HISTORY:  1. Coronary artery disease.  2. History of an MI as well as a stent.  3. Hypertension.  4. Carotid endarterectomy.   ALLERGIES:  None.   CURRENT MEDICINES:  Celexa, Zocor, Lopressor, Amaryl, Zestril, and an  aspirin.   FAMILY HISTORY:  Pertinent for some gallbladder problems in the family but  no other GI problems.   SOCIAL HISTORY:  Does not smoke or drink.  Does use the aspirin as mentioned  above.   REVIEW OF SYSTEMS:  Negative except as above.  Her lower bowels have been  normal.   PHYSICAL EXAMINATION:  GENERAL:  No acute distress, although slightly  uncomfortable.  VITAL SIGNS:  Temperature pertinent for being 101, other vital signs stable.  ABDOMEN:  Soft.  Very minimal upper tenderness.  No guarding or rebound.  Occasional bowel  sounds.   LABS:  Pertinent for a white count of 11, slight left shift, hemoglobin 10.6  with an MCV of 85.  PT 12.8.  Liver tests normal.  Lipase normal.   CT reviewed with Dr. Faylene Million pertinent for the gallstone in the neck of the  gallbladder with some stranding.   ASSESSMENT:  1. Abdominal pain, nausea, low-grade fever probably due to gallstones.  2. Acute cholecystitis.   PLAN:  I have discussed the case with Dr. Luan Pulling.  We will go ahead and  start antibiotics, cefotetan.  She would like an ultrasound.  Possibly will  need a HIDA scan and will be on standby to help if needed per surgical team  but otherwise I will  wait on their opinion.                                               Petra Kuba, M.D.    MEM/MEDQ  D:  08/30/2003  T:  08/30/2003  Job:  [id number]   cc:   Petra Kuba, M.D.  [address]  [place]  [place] [address]  Fax: [phone]   Peter M. Swaziland, M.D.  [address]  [place], [place] [address]  Fax: [phone]   Vikki Ports, M.D.  [phone] [address]  [place]  [place] [address]  Fax: [phone]

## 2010-06-28 NOTE — H&P (Signed)
NAMECRYSTALE, GIANNATTASIO                           ACCOUNT NO.:  [id number]   MEDICAL RECORD NO.:  [id number]                   PATIENT TYPE:  INP   LOCATION:  1826                                 FACILITY:  [facility]   PHYSICIAN:  Quita Skye. Waldon Reining, MD             DATE OF BIRTH:  1932-04-10   DATE OF ADMISSION:  08/29/2003  DATE OF DISCHARGE:                                HISTORY & PHYSICAL   IDENTIFYING DATA AND CHIEF COMPLAINT:  Kara Price is a 77 year old white  woman who is admitted to [facility] for further evaluation of lower  chest and upper abdominal pain.   HISTORY OF PRESENT ILLNESS:  The patient presented to the emergency  department with a history of an ache in her lower substernum and upper  abdomen, which began approximately four hours ago.  The discomfort began as  she was preparing to go shopping.  The discomfort, which cannot be described  more specifically other than just an ache, is located in the distal  substernal region, epigastrium and left lower quadrant.  There are no  exacerbating or ameliorating factors.  It appears to be unrelated to  position, activity, meals or respirations.  She has experienced nausea and  several episodes of vomiting in association with the pain.  There has been  no hematemesis.  There has also been dyspnea and diaphoresis.  The  discomfort appears to be unaffected by nitroglycerin, which she took at  home.  It has improved somewhat, though it has not resolved since her  presentation to the emergency department.   PAST HISTORY:  The patient has a history of coronary artery disease.  She  suffered a myocardial infarction in 2000 and underwent stenting (previous  medical records not yet available).  There is no history of congestive heart  failure or arrhythmia.  She is uncertain as to whether tonight the  discomfort is similar to that which heralded her acute myocardial  infarction.  There has been no diarrhea or hematochezia.   There has been no  change in bowel movements.  She has no other abdominal symptoms.   The patient also has a history of:  1. Hypertension.  2. Dyslipidemia.  3. Type II diabetes mellitus.  4. There is also a past history of depression.   MEDICATIONS:  The patient is on a number of medications.  These include  Celexa, Zocor, metoprolol, lisinopril, and Amaryl.   ALLERGIES:  The patient is not allergic to any medications.   PAST SURGICAL HISTORY:  Right carotid endarterectomy.   ACCIDENTS AND INJURIES:  Significant injuries; none.   FAMILY HISTORY:  Father died of a myocardial infarction in his 27s.  Mother  is in her 62s and well.  She has a brother who has coronary artery disease.  Family history is otherwise unremarkable.   REVIEW OF SYSTEMS:  Review of systems reveals no problems related to her  head, eyes, ears, nose, mouth, throat, lungs, gastrointestinal system,  genitourinary system, or extremities.  There is no history of neurologic or  psychiatric disorder or than as described above.  There is no history of  fever, chills or weight loss.  There is no history of dysuria, hematuria,  frequency or urgency.   PHYSICAL EXAMINATION:  VITAL SIGNS:  Blood pressure 179/78.  Pulse 61 and  regular.  Respirations 20.  Temperature 98.4.  GENERAL APPEARANCE:  The patient is an obese elderly white woman in no acute  distress.  She is alert, oriented, appropriate and responsive.  HEENT:  Head, eye, nose and mouth are normal.  NECK:  The neck is without thyromegaly or adenopathy.  Carotid pulses are  palpable bilaterally and without bruits.  A healed right carotid  endarterectomy scar is noted.  HEART:  Cardiac examination reveals a normal S1 and S2.  There is no S3, S4,  murmur, rub, or click.  Cardiac rhythm is regular.  CHEST:  No chest wall tenderness is noted.  LUNGS:  The lungs are clear.  ABDOMEN:  Examination of the abdomen reveals mild tenderness upon palpation  of the  epigastrium, left upper quadrant and mid abdomen.  There is no mass,  hepatosplenomegaly, rebound, guarding, rigidity, or distention.  Bowel  sounds are present.  RECTAL:  Rectal examination performed by the nurse retrieved stool, which  was heme negative.  EXTREMITIES:  The extremities are without edema, deviation or deformity.  Radial and dorsalis pedal pulses are palpable bilaterally.  NEUROLOGIC EXAMINATION:  Brief screening neurologic survey is unremarkable.   LABORATORY DATA:  Electrocardiogram revealed normal sinus rhythm.  There was  a nonspecific ST abnormality.  QT interval was prolonged at 508 msec  (corrected).  There were no changes specific for ischemia or infarction.  The chest radiograph according to the radiologist demonstrated no evidence  of cardiopulmonary disease.  The initial set of cardiac markers revealed a  myoglobin of 65, CPK MB less than _____ and troponin less than 0.05.  White  count was 11.2 with a hemoglobin of 10.6 and hematocrit of 31.4.  Potassium  was 3.8, BUN 20 and creatinine 1.3.  AST was 16, ALT 15, alk phos 41 and  bilirubin 0.4.  Lipase was 24.  The remaining studies are pending at the  time of this dictation.   IMPRESSION:  1. Lower chest and upper abdominal pain, rule out cardiac ischemia, rule out     abdominal aortic aneurysm, peptic ulcer disease and cholelithiasis.  2. Coronary artery disease status post myocardial infarction followed by     stent in 2000.  3. Hypertension.  4. Dyslipidemia.  5. Anemia.  6. Status post right carotid endarterectomy.  7. History of depression.   PLAN:  1. Telemetry.  2. Serial cardiac enzymes.  3. Aspirin.  4. Intravenous heparin.  5. Intravenous nitroglycerin.  6. Urinalysis.  7. Noncontrast abdominal CT scan to rule out abdominal aortic aneurysm.  8. Evaluate anemia.  9. Further measures per Dr. Swaziland.                                               Quita Skye. Waldon Reining, MD    MSC/MEDQ  D:   08/30/2003  T:  08/30/2003  Job:  [id number]

## 2010-06-28 NOTE — H&P (Signed)
NAMESWAYZEE, WADLEY                           ACCOUNT NO.:  [id number]   MEDICAL RECORD NO.:  [id number]                   PATIENT TYPE:  OIB   LOCATION:  NA                                   FACILITY:  [facility]   PHYSICIAN:  Peter M. Swaziland, M.D.               DATE OF BIRTH:  [birth year]   DATE OF ADMISSION:  10/24/2003  DATE OF DISCHARGE:                                HISTORY & PHYSICAL   Kara Price is a 77 year old white female with history of coronary artery  disease. She is status post acute inferior myocardial infarction in June  2000 with emergent stenting of the right coronary artery. She was last  admitted July 19th through 26th of 2005 with acute gangrenous cholecystitis  and underwent cholecystectomy. Her postoperative course was complicated by  acute congestive heart failure due to volume overload. Troponins at that  time were also mildly elevated. Since that time she has had refractory pain  across her shoulders and down in her left arm. She has complained of  persistent dyspnea. CT of the chest with angio was unremarkable. She  underwent an adenosine Cardiolite study which showed a small area of  inferolateral ischemia. Cardiac catheterization on August 30th showed 50%  stenosis in the mid circumflex which was unchanged from 2000. The stented  segment in the proximal to mid right coronary artery had a 40% in-stent  restenosis. There was a focal 80% stenosis in the distal right coronary  artery. Due to her refractory symptoms and evidence of ischemia on  Cardiolite studies she is now admitted to undergo percutaneous intervention  of the distal right coronary artery.   PAST MEDICAL HISTORY:  1.  Status post cholecystectomy for gangrenous cholecystitis.  2.  Status post inferior myocardial infarction with stenting of the right      coronary artery in 2000.  3.  Hypertension.  4.  Diabetes mellitus, type 2.  5.  Hyperlipidemia.   ALLERGIES:  No known allergies.   CURRENT  MEDICATIONS:  1.  Amaryl 4 mg per day.  2.  Aspirin 325 mg a day.  3.  Zocor 20 mg per day.  4.  Celexa 40 mg q.h.s.  5.  Metoprolol 25 mg b.i.d.  6.  Zestril 40 mg per day.  7.  Lasix 20 mg per day.  8.  Potassium 20 mEq daily.  9.  Norvasc 5 mg per day.   SOCIAL HISTORY:  The patient quit smoking in 2000. She is married and has  two children. She denies alcohol intake.   FAMILY HISTORY:  Father died at age 43 of myocardial infarction.   REVIEW OF SYSTEMS:  As noted in HPI. Otherwise is negative.   PHYSICAL EXAMINATION:  GENERAL: The patient is a pleasant white female in no  apparent distress.  VITAL SIGNS: Weight is 150, blood pressure 128/70, pulse 72 and regular.  HEENT: Pupils equal, round,  and reactive. Conjunctiva clear. Oropharynx is  clear.  NECK: Supple without JVD, adenopathy, thyromegaly, or bruits.  LUNGS: Clear.  CARDIAC: Regular rate and rhythm without murmurs, rubs, gallops, or clicks.  ABDOMEN: Soft and nontender. She has a healed cholecystectomy scar. Pulses  are 2+ distally without edema.  NEUROLOGIC: Intact.   LABORATORY DATA:  ECG shows normal sinus rhythm with nonspecific T-wave  abnormality. Chest x-ray showed mild lingular atelectasis, but otherwise  clear.   IMPRESSION:  1.  Atypical chest pain, refractory. Cardiac catheterization shows a focal      distal right coronary artery stenosis. There is mild ischemia noted on      Cardiolite study. Other evaluation has been unremarkable.  Will proceed      with intervention of the distal right coronary artery.  2.  Hypertension.  3.  Diabetes mellitus.  4.  Status post cholecystectomy.                                                Peter M. Swaziland, M.D.    PMJ/MEDQ  D:  10/23/2003  T:  10/23/2003  Job:  [id number]   cc:   Lorne Skeens. Hoxworth, M.D.  [address]  [place]  [place] [address]  Fax: [phone]   Juline Patch, M.D.  [address]  [place], [place] [address]  Fax:  [phone]

## 2010-06-28 NOTE — Procedures (Signed)
[name]  Patient:    Kara Price, Kara Price                          MRN: [id number] Proc. Date: 07/21/00 Adm. Date:  [id number] Attending:  Sabino Gasser                           Procedure Report  PROCEDURE PERFORMED:  Colonoscopy.  ENDOSCOPIST:  Sabino Gasser, M.D.  INDICATIONS FOR PROCEDURE:  Colon cancer screening.  ANESTHESIA:  Demerol 75 mg, Versed 7.5 mg.  DESCRIPTION OF PROCEDURE:  With the patient mildly sedated in the left lateral decubitus position, subsequently rolled to her back and the right side, the Olympus videoscopic variable stiffness pediatric colonoscope was inserted in the rectum and passed under direct vision into the cecum.  The cecum was identified by the ileocecal valve and appendiceal orifice, both of which were photographed.  From this point, the endoscope was slowly withdrawn, taking circumferential views of the entire colonic mucosa stopping only in the rectum which appeared normal on direct view and showed internal hemorrhoids on retroflex view.  The endoscope was straightened and withdrawn.  Patients vital signs and pulse oximeter remained stable.  The patient tolerated the procedure well and without apparent complications.  FINDINGS:  Internal hemorrhoids.  Otherwise unremarkable examination.  PLAN:  Possible repeat examination in five to 10 years. DD:  07/21/00 TD:  07/21/00 Job: [id number]

## 2010-06-28 NOTE — Discharge Summary (Signed)
NAME:  FRANKI, ALCAIDE                           ACCOUNT NO.:  [id number]   MEDICAL RECORD NO.:  [id number]                   PATIENT TYPE:  INP   LOCATION:  4710                                 FACILITY:  [facility]   PHYSICIAN:  Peter M. Swaziland, M.D.               DATE OF BIRTH:  [birth year]   DATE OF ADMISSION:  08/29/2003  DATE OF DISCHARGE:  09/05/2003                                 DISCHARGE SUMMARY   HISTORY OF PRESENT ILLNESS:  Ms. Paradiso is a 77 year old white female who  presented with symptoms of lower chest and upper abdominal pain.  The  patient describes this as an ache in her lower substernal region and upper  abdomen and began 4 hours prior to admission.  There were no exacerbating or  alleviating factors.  Her symptoms were unrelated to position, activity, or  meals.  She had nausea and several episodes of vomiting in association with  the pain.  She had no dyspnea.  The patient was unaffected by nitroglycerin  which she took at home.  With no resolution of her pain, she presented to  the emergency room.   Ms. Levi does have no coronary artery disease having suffered an inferior  myocardial infarction in 2000 with prior stenting of the proximal right  coronary artery.  At that time she had a 60 to 70% stenosis in the  circumflex vessel. This was treated medically.  She had a negative  Cardiolite study in 2001.  She does have a history of hypertension,  dyslipidemia, diabetes mellitus, type 2.  She has also had previous right  carotid endarterectomy.   For details of her Past Medical History, Social History, Family History, and  Physical Examination, please see admission History and Physical.   LABORATORY AND X-RAY DATA:  Chest x-ray showed no active cardiopulmonary  disease.   ECG showed normal sinus rhythm with prolonged QT 508 msec corrected.  There  were minor nonspecific ST abnormalities.  White count 11,200, hemoglobin  10.6, hematocrit 31.4, platelets 300,000.   Occult stool was negative for  blood.  Coags were normal. Sodium 140, potassium 3.8, chloride 109, CO2 24,  BUN 20, creatinine 1.3, glucose 198, calcium 8.9.  All other chemistries  were normal including lipase.  Magnesium 2.0.  CK 81 with negative MB,  troponin 0.03.  Urinalysis shows specific gravity 1.025, pH 7.5, otherwise  negative.  Blood cultures on admission positive with 2 culture with E. coli  that was pan sensitive.  Urine cultures were negative.   HOSPITAL COURSE:  The patient was admitted to telemetry.  GI consult was  obtained.   Abdominal CT was performed. This demonstrated gallstone in the neck of  gallbladder with mild gallbladder distention and stranding of the  gallbladder.  This is consistent with acute cholecystitis.  Otherwise,  abdominal CT was unremarkable.   She also had abdominal ultrasound which again confirmed the  gallstone with  gallbladder wall thickening and positive Murphy's sign.   Subsequent surgical consultation was obtained with Dr. Danna Hefty  who recommended patient undergo cholecystectomy.  Exam did show evidence of  decreased bowel sounds and tender epigastrium and right upper quadrant to  palpation.  She developed hypotension with blood pressure in 80s.  This  necessitated holding her cardiac medications. She was hydrated with IV  fluids.   On August 31, 2003, she underwent laparoscopic cholecystectomy which was  consistent with gangrenous cholecystitis.  Postoperatively, she again was  hypotensive requiring fluid challenges and holding of metoprolol and  Lisinopril.  Her diet was advanced to liquids, and she had resolution of her  abdominal pain and no nausea or vomiting.  Liver function studies remained  normal.   On September 03, 2003, the patient complained of increased chest pain.  She was  severely hypertensive at that time and tachycardic.  She was anxious.  Her  pain was relieved with morphine.  She did complain of congestion when  she  lies down and has increased shortness of breath.  Again, she was no  hypertensive with blood pressure over 180s systolic.  ECG obtained during  the pain showed ST depression in the anterior leads.  At this point, she was  started on aspirin and beta blocker therapy.  Serial CPKs were obtained.  It  is noteworthy that CPKs were negative, but troponins were elevated with a  peak of 0.2 which subsequently declined.  She had no further chest pain.  She continued to have symptoms of dyspnea.  Given her fluid challenge, she  was grossly volume overloaded.  Chest x-ray showed evidence of congestive  heart failure.  BNP level had increased to 1605.  She was diuresed with IV  Lasix.  She was resumed on her ACE inhibitor.  She remained hypertensive.  In addition to her beta blocker and Lisinopril, she was started on Norvasc.  After diuresis, BNP level dropped to 589.  Chest x-ray showed minimal  effusion and atelectasis with resolution of her congestive heart failure.  BUN and creatinine had declined to 14 and 1.2, respectively.  It was felt  the mild troponin elevation was related to her acute congestive heart  failure.   With improvement in her blood pressure, it was felt that she was stable for  discharge, and she was discharged home in stable condition on September 05, 2003.   DISCHARGE DIAGNOSES:  1. Acute gangrenous cholecystitis.  2. Acute congestive heart failure secondary to volume overload.  3. Elevation of cardiac troponins probably secondary to congestive heart     failure versus acute hypertension versus unstable coronary syndrome.  4. Hypertension.  5. Diabetes mellitus, type 2.  6. Status post carotid endarterectomy.   DISCHARGE MEDICATIONS:  1. Aspirin 325 mg per day.  2. Amaryl 4 mg per day.  3. Zocor 20 mg per day.  4. Celexa 40 mg q.h.s.  5. Toprol 50 mg twice a day.  6. Zestril 40 mg per day. 7. Lasix 20 mg per day.  8. Potassium 20 mEq daily.  9. Norvasc 5 mg per  day.  10.      Hydrocodone/APAP 5/500 mg every 4 hours as needed.   DIET:  The patient is instructed on low-salt, low-fat diet.   FOLLOW UP:  She will follow up in one to two weeks with Dr. Swaziland.  Once  she has recovered from cholecystitis, will consider followup adenosine  Cardiolite study to asses for  coronary ischemia.   DISCHARGE STATUS:  Improved.                                                Peter M. Swaziland, M.D.    PMJ/MEDQ  D:  10/05/2003  T:  10/05/2003  Job:  [id number]   cc:   Vikki Ports, M.D.  [address]  [place]  [place] [address]  Fax: [phone]   Petra Kuba, M.D.  [address]  [place]  [place] [address]  Fax: [phone]

## 2010-06-28 NOTE — Assessment & Plan Note (Signed)
Kara Price                             PULMONARY OFFICE NOTE   Kara Price, Kara Price                        MRN:          [id number]  DATE:01/12/2006                            DOB:          1932-12-08    PROBLEMS:  1. Previous history of abnormal chest CT/left lower lobe infiltrate.  2. Pulmonary restriction.  3. Asthmatic bronchitis/chronic obstructive pulmonary disease.  4. Coronary disease/infarction/stent.  5. Diabetes.  6. Hypertension.  7. Chronic anemia.   HISTORY:  She returns for a 85-month followup saying she is doing fine.  She uses Spiriva but has not been using either her nebulizer machine or  her Advair.  She denies cough, wheeze or substantial limitation with a  low activity level.  She had pneumococcal vaccine a year ago and has had  flu shot.  She denies sputum, chest pain or palpitation.   MEDICATION:  1. Lisinopril 40 mg.  2. Lopressor 50 mg.  3. Celexa 40 mg.  4. Zocor 20 mg.  5. AcipHex 20 mg.  6. Ultracet.  7. Aspirin 325 mg.  8. Aricept 5 mg.  9. Spiriva once daily.   OBJECTIVE:  Weight 154 pounds, BP 130/70, pulse regular at 56, room air  saturation 96%.  Heart sounds are regular without murmur or gallop.  There is no edema, no neck vein distension and no cyanosis.  I find no  adenopathy.  Breath sounds are somewhat shallow and there is left  basilar dullness without rales, rub or wheeze.  Heart sounds are regular  without murmur.   Chest x-ray:  Her film here from July 14, 2005, was stable with no active  lung disease and no change in elevation of the left hemidiaphragm, which  is a chronic finding consistent with a restricted pattern on her chest x-  ray.   IMPRESSION:  1. Asthma with chronic obstructive pulmonary disease.  2. Left hemidiaphragm paresis.   PLAN:  1. I talked with her about her medications, emphasizing use of an      albuterol inhaler which we are giving today as sample, for rescue      use  instead of the Advair that she used to carry for rescue use.  I      explained the differences.  2. She is encouraged to walk for endurance and to keep her weight      down.  3. Prescription Proventil HFA inhaler two puffs q.i.d. p.r.n.  4. Schedule return 6 months, earlier p.r.n.     Clinton D. Maple Hudson, MD, Tonny Bollman, FACP  Electronically Signed    CDY/MedQ  DD: 01/12/2006  DT: 01/13/2006  Job #: [id number]   cc:   Juline Patch, M.D.  Peter M. Swaziland, M.D.

## 2010-06-28 NOTE — Cardiovascular Report (Signed)
NAME:  EDWARDINE, DESCHEPPER                           ACCOUNT NO.:  [id number]   MEDICAL RECORD NO.:  [id number]                   PATIENT TYPE:  OIB   LOCATION:  6527                                 FACILITY:  [facility]   PHYSICIAN:  Peter M. Swaziland, M.D.               DATE OF BIRTH:  11-25-1932   DATE OF PROCEDURE:  10/24/2003  DATE OF DISCHARGE:                              CARDIAC CATHETERIZATION   INDICATION FOR PROCEDURE:  A 77 year old white female who presented with  symptoms of persistent shortness of breath and atypical chest pain.  Cardiac  catheterization demonstrates moderate disease in the proximal right coronary  artery at the site of previous stent and more severe lesion in the distal  right coronary artery.  Her Cardiolite study does show mild ischemia in the  inferior lateral distribution.   ACCESS:  Via the right femoral artery using the standard Seldinger  technique.   EQUIPMENT:  6 French arterial sheath, 6 [place] 3DRC Vista guide, 0.014 Hi-  Torque Floppy wire, a 3.5 x 10-mm cutting balloon, a 3.0 x 15-mm Maverick  balloon, 3.0 x 18-mm Cypher drug-eluting stent.   MEDICATIONS:  Versed 1 mg IV, albuterol nebulized inhaler 5 mg x1, Angiomax  bolus at 0.75 mg/kg followed by drip at 1.75 mg/kg/hour, nitroglycerin 200  mcg intracoronary x2.   CONTRAST:  125 mL of Omnipaque.   PROCEDURE NOTE:  After initial guide shots were obtained and the patient was  anticoagulated we proceeded with intervention.  The lesions were crossed  easily with the guide wire.  We initially performed a cutting balloon  angioplasty on the focal lesion in the proximal stent.  This was dilated to  6 atmospheres x2 with excellent angiographic result and less than 10%  residual stenosis.  We then proceeded with stenting of the distal right  coronary lesion just after the crux.  This was predilated using a 3.0 x 15-  mm  Maverick balloon to 6 atmospheres.  We then exchanged for a 3.0 x 18-mm  Cypher  stent.  This was deployed at 11 atmospheres and then post dilated to  14 atmospheres with excellent angiographic result and 0% residual stenosis.  The patient tolerated the procedure well without complications.   FINAL INTERPRETATION:  1.  Successful cutting balloon angioplasty of the moderate proximal right      coronary stenosis in-stent.  2.  Successful stenting of the distal right coronary artery.                                               Peter M. Swaziland, M.D.    PMJ/MEDQ  D:  10/24/2003  T:  10/24/2003  Job:  [id number]   cc:   Juline Patch, M.D.  [address]  [place],  [place] [address]  Fax: [phone]   Lorne Skeens. Hoxworth, M.D.  [address]  [place]  [place] [address]  Fax: [phone]

## 2010-06-28 NOTE — Op Note (Signed)
[place]  Patient:    Kara Price, Kara Price Visit Number: [id number] MRN: [id number]          Service Type: SUR Location: 3300 [id number] Attending Physician:  Colvin Caroli Dictated by:   Quita Skye Hart Rochester, M.D. Proc. Date: 06/09/01 Admit Date:  06/09/2001 Discharge Date: 06/10/2001                             Operative Report  PREOPERATIVE DIAGNOSIS:  Severe right internal carotid stenosis -- asymptomatic.  POSTOPERATIVE DIAGNOSIS:  Severe right internal carotid stenosis -- asymptomatic.  OPERATION:  Right carotid endarterectomy with Dacron patch angioplasty.  SURGEON:  Quita Skye. Hart Rochester, M.D.  FIRST ASSISTANT:  Sherrie George, P.A.  ANESTHESIA:  General endotracheal.  BRIEF HISTORY:  This patient was found to have a moderate right internal carotid stenosis, which has been followed, and this has now progressed to a moderately severe lesion with velocities of 370 cm/sec.  This was confirmed by angiography and she was scheduled for right carotid endarterectomy.  DESCRIPTION OF PROCEDURE:  Patient was taken to the operating room and placed in a supine position, at which time satisfactory general endotracheal anesthesia was administered.  The right neck was prepped with Betadine scrubbing solution and draped in routine sterile manner.  An incision was made along the anterior border of the sternocleidomastoid muscle and carried down through subcutaneous tissue and platysma using the Bovie.  The common facial vein and external jugular vein were ligated with 3-0 silk ties and divided, exposing the common, internal and external carotid arteries.  Care was taken not to injure the vagus or hypoglossal nerves, both of which were exposed. There was a calcified atherosclerotic plaque at the carotid bifurcation, extending up the internal carotid artery about 4 cm; the distal vessel appeared normal.  A #10 shunt was prepared and the patient was  heparinized. The carotid vessels were occluded with vascular clamps, longitudinal opening made in the common carotid with a 15 blade and extended up the internal carotid with the Potts scissors to a point distal to the disease.  Plaque was at least 80% stenotic in severity at a point about 3 cm above the bifurcation. A #10 shunt was inserted without difficulty, reestablishing flow in about 2 minutes.  Standard endarterectomy was then performed using the elevator and the Potts scissors with an eversion endarterectomy of the external carotid. The plaque feathered off the distal internal carotid artery nicely, not requiring any tacking sutures.  Lumen was thoroughly irrigated with heparin and saline and all loose debris carefully removed and arteriotomy was closed with a patch using continuous 6-0 Prolene.  Prior to completion of the closure, the shunt was removed after about 30 minutes of shunt time. Following antegrade and retrograde flushing, the closure was completed with reestablishment of flow initially up the external and then up the internal branch.  Carotid was occluded for less than two minutes for removal of the shunt.  Protamine was then given to reverse the heparin.  Following adequate hemostasis, wound was irrigated with saline and closed in layers with Vicryl in a subcuticular fashion, sterile dressing applied and patient taken to the recovery room in satisfactory condition. Dictated by:   Quita Skye Hart Rochester, M.D. Attending Physician:  Colvin Caroli DD:  06/09/01 TD:  06/09/01 Job: [id number]

## 2010-06-28 NOTE — Consult Note (Signed)
NAME:  NORBERTA, STOBAUGH                           ACCOUNT NO.:  [id number]   MEDICAL RECORD NO.:  [id number]                   PATIENT TYPE:  INP   LOCATION:  4710                                 FACILITY:  [facility]   PHYSICIAN:  Petra Kuba, M.D.                 DATE OF BIRTH:  1932/09/10   DATE OF CONSULTATION:  DATE OF DISCHARGE:                                   CONSULTATION   CONSULTING PHYSICIAN:  Petra Kuba, M.D.   Dictation ended at this point.                                               Petra Kuba, M.D.    MEM/MEDQ  D:  08/30/2003  T:  08/30/2003  Job:  [id number]   cc:   Peter M. Swaziland, M.D.  [address]  [place], [place] [address]  Fax: [phone]

## 2010-06-28 NOTE — H&P (Signed)
[place]  Patient:    Kara Price, Kara Price Visit Number: [id number] MRN: [id number]          Service Type: SUR Location: 3300 [id number] Attending Physician:  Colvin Caroli Dictated by:   Adair Patter, P.A. Admit Date:  06/09/2001 Discharge Date: 06/10/2001                           History and Physical  CHIEF COMPLAINT:  Carotid artery disease.  HISTORY OF PRESENT ILLNESS:  This is a 77 year old female who is referred to Dr. Quita Skye. Lawson by Dr. Jenel Lucks.  The patient said that one night she felt that she had a thumping in her neck.  Because of this, she was seen by her medical doctor, who found the patient to have a carotid bruit on her right side during her physical examination; because of this, she was referred to Dr. Hart Rochester, who performed a carotid Duplex which revealed that patient had significant carotid stenosis.  The patient denies any headache, nausea, vomiting, vertigo, dizziness, history of falls, seizures, numbness, tingling, muscle weakness or speech impairment.  She denies any dysphagia, visual changes, syncope, presyncope, memory loss or confusion.  PAST MEDICAL HISTORY: 1. Carotid artery disease. 2. Myocardial infarction. 3. Type 2 diabetes mellitus. 4. Hypertension. 5. Hypercholesterolemia. 6. Depression.  PAST SURGICAL HISTORY:  Percutaneous transluminal coronary angioplasty stenting in 2000.  ALLERGIES:  The patient denies any known medication allergies.  MEDICATIONS: 1. Amaryl 4 mg p.o. q.d. 2. Metoprolol 25 mg p.o. q.d. 3. Celexa 40 mg p.o. q.d. 4. Zocor 40 mg p.o. q.d. 5. Lisinopril 40 mg p.o. q.d.  FAMILY HISTORY:  The patients mother is still living; she is 81 years old and has hypertension.  She has no family history of coronary artery disease.  She has a grandfather that had cancer and she has a grandmother that had diabetes mellitus and had a stroke.  SOCIAL HISTORY:  The patient is married and  lives with her husband.  She has had two children, one who is still living.  She denies any alcohol use.  She says she quit smoking in the year 2000 but has a 30-year history of smoking 1 pack of cigarettes per day.  REVIEW OF SYSTEMS:  GENERAL:  The patient denies any fever, chills, night sweats or frequent illnesses.  HEAD:  The patient denies any head injuries or loss of consciousness.  EYES:  The patient wears corrective lenses.  Denies any glaucoma or cataracts.  EARS:  The patient denies any vertigo, tinnitus, hearing loss or ear infections.  NOSE:  The patient denies any epistaxis, rhinitis or sinusitis.  MOUTH:  The patient denies any problems with dentition or frequent sore throats.  NECK:  The patient denies any lumps, masses, or pain with range of motion in her neck.  CARDIOVASCULAR:  The patient has a history of myocardial infarction which was treated with angioplasty and hypertension which is treated medically.  PULMONARY:  The patient denies any asthma, bronchitis, emphysema or pneumonia.  GI:  The patient denies any nausea, vomiting, diarrhea, constipation, hematochezia or melena.  ENDOCRINE: The patient has diabetes mellitus which is treated medically.  She denies any thyroid disease.  MUSCULOSKELETAL:  The patient denies any arthritis, arthralgias or myalgias.  NEUROLOGIC:  The patient denies any stroke, transient ischemic attack, memory loss or depression.  PHYSICAL EXAMINATION:  VITAL SIGNS:  Blood pressure is 140/70, bilaterally.  Pulse  is 84 and regular. Respirations are 16.  GENERAL:  The patient is alert and oriented x3 and is in no distress.  HEENT:  Head is atraumatic, normocephalic.  Eyes:  Pupils equal, round and reactive to light and accommodation.  Extraocular motions are intact without scleral icterus or nystagmus.  Ears:  Auditory acuity is grossly intact. Nose:  Nasal patency is intact.  Sinuses are nontender.  Mouth is moist without exudates.  NECK:   Supple and without JVD, lymphadenopathy or thyromegaly.  She had a loud carotid bruit on the right side, none on the left.  CARDIOVASCULAR:  Regular rate and rhythm without murmurs, gallops or rubs.  LUNGS:  Bilaterally clear to auscultation without rales, rhonchi or wheezing.  ABDOMEN:  Soft, nontender and nondistended.  Positive bowel sounds in all four quadrants.  EXTREMITIES:  No cyanosis, clubbing or edema.  NEUROLOGIC:  The patient had cranial nerves II-XII grossly intact.  She has 5+ and equal muscle strength in all four extremities.  Her gait was steady without the use of any assistive devices.  IMPRESSION:  Right carotid artery stenosis.  PLAN:  The patient will undergo a right carotid endarterectomy by Dr. Hart Rochester. Dictated by:   Adair Patter, P.A. Attending Physician:  Colvin Caroli DD:  06/07/01 TD:  06/07/01 Job: [id number]

## 2010-06-28 NOTE — Consult Note (Signed)
NAME:  Kara Price, Kara Price                           ACCOUNT NO.:  [id number]   MEDICAL RECORD NO.:  [id number]                   PATIENT TYPE:  INP   LOCATION:  4710                                 FACILITY:  [facility]   PHYSICIAN:  Vikki Ports, M.D.         DATE OF BIRTH:  December 05, 1932   DATE OF CONSULTATION:  08/30/2003  DATE OF DISCHARGE:                                   CONSULTATION   REFERRING PHYSICIAN:  Petra Kuba, M.D.   REASON FOR CONSULTATION:  Acute cholecystitis.   BRIEF HISTORY OF PRESENT ILLNESS:  The patient is a 77 year old white female  with a known history of coronary artery disease, status post MI.  Stent  placed in 2000.  The patient came in with epigastric, abdominal pain, and  atypical chest pain.  Workup including serial cardiac enzymes were negative  for a cardiac source.  The patient underwent CT scan of the abdomen which  showed a large stone in the neck of the gallbladder, thickened gallbladder  wall, and no pericholecystic fluid.  The patient continued to have pain even  after a GI cocktail.  The patient then underwent ultrasound which showed a  thickened gallbladder wall with a stone lodged in the neck of the  gallbladder.  Since admission, the patient's bilirubin has increased from  0.6 to 1.4.  Transaminases have remained fairly normal.  The creatinine was  also increased to 1.8 from 1.1.   PAST MEDICAL HISTORY:  Significant for:  1. Coronary artery disease.  2. Cardiovascular disease.  3. Hypertension.  4. Hypercholesterolemia.   MEDICATIONS:  Celexa, Zocor, Lopressor, Amaryl, Zestril, and an  antidepressant.   PHYSICAL EXAMINATION:  GENERAL APPEARANCE:  She is an age-appropriate white  female in moderate distress.  VITAL SIGNS:  Her temperature is 99.6 degrees, her heart rate is 70, her  respirations are 16, and the blood pressure is 165/80.  HEENT:  Exam is benign.  The sclerae are nonicteric.  NECK:  Supple and soft with a well-healed CA  incision on the right side.  LUNGS:  Clear to auscultation and percussion.  HEART:  Regular rate and rhythm without murmurs, rubs, or gallops.  ABDOMEN:  Soft, but somewhat tender in the epigastrium.   IMPRESSION:  Acute cholecystitis.   PLAN:  Laparoscopic cholecystectomy with intraoperative cholangiogram.                                               Vikki Ports, M.D.    KRH/MEDQ  D:  08/31/2003  T:  08/31/2003  Job:  [id number]

## 2010-06-28 NOTE — Op Note (Signed)
[name]. [facility]  Patient:    Kara Price, Kara Price Visit Number: [id number] MRN: [id number]          Service Type: DSU Location: [facility] 2899 17 Attending Physician:  Alyson Locket Dictated by:   Larina Earthly, M.D. Proc. Date: 06/02/01 Admit Date:  06/02/2001   CC:         Quita Skye. Hart Rochester, M.D.   Operative Report  PREOPERATIVE DIAGNOSIS:  Severe right internal carotid artery stenosis.  POSTOPERATIVE DIAGNOSIS:  Severe right internal carotid artery stenosis.  OPERATION PERFORMED:  Arch aortogram and cervical and intracranial arteriogram.  SURGEON:  Larina Earthly, M.D.  ASSISTANT:  Caralee Ates, M.D.  ANESTHESIA:  1% lidocaine local.  COMPLICATIONS:  None.  DISPOSITION:  To holding area stable.  DESCRIPTION OF PROCEDURE:  The patient was taken to the peripheral vascular cath lab and placed in supine position where the area of the right groin was prepped and draped in the usual sterile fashion.  Using local anesthesia and single wall puncture, the right common femoral artery was entered.  A guide wire was passed up to the level of the aortic arch.  A 5 French sheath was passed over the guide wire.  A pigtail catheter was positioned in the arch and a right anterior oblique projection was undertaken.  This revealed widely patent great vessels off the arch.  The right and left vertebral arteries were normal size and widely patent.  Next the headhunter catheter was used to selectively catheterize the innominate and then the right common carotid artery.  A Wholey wire was positioned up into the common carotid artery and the headhunter catheter was positioned at this level.  AP and lateral and oblique cervical views of the internal carotid artery were taken at this level.  This revealed 75 to 80% stenosis at this level.  Intracranial views were also obtained and these will be dictated as a separate interpretation by [facility] radiology.  The  headhunter catheter was then removed over a wire and a Bernstein catheter was used to selectively catheterize the left common carotid artery.  This was then exchanged for an end hole catheter and left carotid injections were undertaken.  This revealed mild irregularity in the carotid bifurcation and plaque present.  There was no evidence of any significant flow limiting stenosis at this level in the left carotid system. The catheter was removed and pressure was held for hemostasis in the right groin.  The patient tolerated the procedure without immediate complications and was transferred to the holding area in stable condition. Dictated by:   Larina Earthly, M.D. Attending Physician:  Alyson Locket DD:  06/02/01 TD:  06/02/01 Job: [id number]

## 2010-06-28 NOTE — Consult Note (Signed)
[place]. [facility]  Patient:    Kara Price, Kara Price Visit Number: [id number] MRN: [id number]          Service Type: DSU Location: [facility] 2899 17 Attending Physician:  Alyson Locket Dictated by:   Kristine Royal, M.D. Proc. Date: 06/02/01 Admit Date:  06/02/2001 Discharge Date: 06/02/2001   CC:         Caralee Ates, M.D.   Consultation Report  REASON FOR CONSULTATION: Intracranial interpretation from cerebral arteriogram.  INTERPRETATION: The study was performed on April 23. 2003 by Dr. Tawanna Cooler Early and Dr. Brayton Layman.  The right common carotid artery injection results in opacification of the right internal carotid artery, which is widely patent into the brain without siphon stenosis. The anterior and middle cerebral vessels are patent bilaterally and show no stenosis, aneurysm, or vascular malformation. The left A1 segment is diminutive on a congenital basis. A left common carotid artery injection results in opacification of the left internal carotid artery, which is widely patent into the brain. The anterior and middle cerebral vessels are widely patent without stenosis, aneurysm, or vascular malformation. On the lateral view, there seems to be a 2-3 mm ophthalmic artery origin aneurysm. Intracranial peripheral branches all appear normal. Venous phase images are normal. The left internal carotid artery injection gives dominant supply to both anterior cerebral arteries.  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 Dictated by:   Kristine Royal, M.D. Attending Physician:  Alyson Locket DD:  06/02/01 TD:  06/02/01 Job: [id number]

## 2010-06-28 NOTE — Cardiovascular Report (Signed)
Kara Price, REMMERT                           ACCOUNT NO.:  [id number]   MEDICAL RECORD NO.:  [id number]                   PATIENT TYPE:  OIB   LOCATION:  6501                                 FACILITY:  [facility]   PHYSICIAN:  Peter M. Swaziland, M.D.               DATE OF BIRTH:  [birth year]   DATE OF PROCEDURE:  10/10/2003  DATE OF DISCHARGE:  10/10/2003                              CARDIAC CATHETERIZATION   INDICATION FOR PROCEDURE:  A 77 year old white female who has undergone  recent cholecystectomy for acute gangrenous cholecystitis.  Postoperative  course was complicated by congestive heart failure, small elevation of her  troponin.  Subsequent stress Cardiolite study showed evidence of inferior  apical ischemia and reduced ejection fraction of 39%.  The patient has  remote history of inferior myocardial infarction treated with emergent  stenting of the proximal right coronary artery.  She does have history of  diabetes and hypertension.   PROCEDURE:  Left heart catheterization, coronary and left ventricular  angiography.   ACCESS:  Via the right femoral artery using standard Seldinger technique.  Arterial access was difficult, but was obtained and subsequent catheter  exchanges were done over wire.   EQUIPMENT:  4 French 4 cm right and left Judkins catheter, 4 French pigtail  catheter, 4 French arterial sheath.   MEDICATIONS:  Local anesthesia with 1% Xylocaine, Versed 1 mg IV.   CONTRAST:  100 mL of Omnipaque.   HEMODYNAMIC DATA:  Aortic pressure 97/42 with mean of 63.  Left ventricular  pressure was 109 with EDP of 11 mmHg.   ANGIOGRAPHIC DATA:  The left coronary artery arises and distributes  normally.  The left main coronary artery is short and appears without  significant disease.   The left anterior descending artery is moderately calcified.  It has  approximately 20% narrowing proximally, but no significant obstructive  disease is noted.   The left circumflex  coronary artery has a 50-60% stenosis in the mid vessel.  There is approximately 20% narrowing in the first marginal branch.   The right coronary artery is heavily calcified.  There is a stent noted in  the proximal vessel.  This is a large dominant vessel.  There is  approximately 40% in-stent restenosis in the proximal portion of the stent.  There is then a focal 80% stenosis in the distal right coronary artery  distal to the crux.   LEFT VENTRICULAR ANGIOGRAPHY:  Performed in the RAO view.  This demonstrates  normal left ventricular size and contractility with normal systolic  function.  There were no segmental wall motion abnormalities.  Ejection  fraction is estimated at 60%.  There is no mitral regurgitation.   FINAL INTERPRETATION:  1.  Single-vessel obstructive atherosclerotic coronary artery disease      involving the distal right coronary artery.  The previously placed stent      in the proximal right coronary  is still patent although there is some      mild in-stent restenosis.  There is modest disease in the mid circumflex      coronary which is chronic according to old cath report.  2.  Normal left ventricular function and filling pressures.   PLAN:  Will consider catheter based intervention of the lesion in the distal  right coronary artery.  It may be worthwhile performing intravascular  ultrasound of the right coronary artery stent.  We will discuss this further  with the patient.                                               Peter M. Swaziland, M.D.    PMJ/MEDQ  D:  10/10/2003  T:  10/10/2003  Job:  [id number]

## 2010-07-22 ENCOUNTER — Encounter: Payer: Self-pay | Admitting: Internal Medicine

## 2010-07-23 ENCOUNTER — Ambulatory Visit: Payer: Self-pay | Admitting: Internal Medicine

## 2010-09-13 ENCOUNTER — Encounter: Payer: Self-pay | Admitting: *Deleted

## 2010-09-19 ENCOUNTER — Ambulatory Visit: Payer: Self-pay | Admitting: Cardiology

## 2011-08-29 ENCOUNTER — Encounter: Payer: Self-pay | Admitting: Vascular Surgery

## 2012-05-04 ENCOUNTER — Encounter: Payer: Self-pay | Admitting: Cardiology

## 2016-01-01 ENCOUNTER — Encounter: Payer: Self-pay | Admitting: *Deleted

## 2016-01-01 NOTE — Telephone Encounter (Signed)
This encounter was created in error - please disregard.

## 2019-11-11 DEATH — deceased
